# Patient Record
Sex: Female | Born: 1945
Health system: Southern US, Community
[De-identification: ages and names within clinical notes are randomized; demographics above are authoritative.]

## PROBLEM LIST (undated history)

## (undated) DIAGNOSIS — E039 Hypothyroidism, unspecified: Secondary | ICD-10-CM

## (undated) DIAGNOSIS — Z8489 Family history of other specified conditions: Secondary | ICD-10-CM

## (undated) DIAGNOSIS — J45909 Unspecified asthma, uncomplicated: Secondary | ICD-10-CM

## (undated) DIAGNOSIS — I10 Essential (primary) hypertension: Secondary | ICD-10-CM

## (undated) DIAGNOSIS — K219 Gastro-esophageal reflux disease without esophagitis: Secondary | ICD-10-CM

## (undated) HISTORY — PX: EYE SURGERY: SHX253

## (undated) HISTORY — PX: BREAST SURGERY: SHX581

---

## 1968-10-02 HISTORY — PX: THYROID SURGERY: SHX805

## 2000-03-11 ENCOUNTER — Other Ambulatory Visit: Admission: RE | Admit: 2000-03-11 | Discharge: 2000-03-11 | Payer: Self-pay | Admitting: Obstetrics and Gynecology

## 2000-04-05 ENCOUNTER — Ambulatory Visit (HOSPITAL_COMMUNITY): Admission: RE | Admit: 2000-04-05 | Discharge: 2000-04-05 | Payer: Self-pay | Admitting: Obstetrics and Gynecology

## 2000-04-05 ENCOUNTER — Encounter: Payer: Self-pay | Admitting: Obstetrics and Gynecology

## 2000-04-13 ENCOUNTER — Encounter (INDEPENDENT_AMBULATORY_CARE_PROVIDER_SITE_OTHER): Payer: Self-pay

## 2000-04-13 ENCOUNTER — Other Ambulatory Visit: Admission: RE | Admit: 2000-04-13 | Discharge: 2000-04-13 | Payer: Self-pay | Admitting: Obstetrics and Gynecology

## 2000-07-13 ENCOUNTER — Other Ambulatory Visit: Admission: RE | Admit: 2000-07-13 | Discharge: 2000-07-13 | Payer: Self-pay | Admitting: Obstetrics and Gynecology

## 2000-07-13 ENCOUNTER — Encounter (INDEPENDENT_AMBULATORY_CARE_PROVIDER_SITE_OTHER): Payer: Self-pay | Admitting: Specialist

## 2002-04-24 ENCOUNTER — Other Ambulatory Visit: Admission: RE | Admit: 2002-04-24 | Discharge: 2002-04-24 | Payer: Self-pay | Admitting: Obstetrics and Gynecology

## 2002-06-18 ENCOUNTER — Encounter (INDEPENDENT_AMBULATORY_CARE_PROVIDER_SITE_OTHER): Payer: Self-pay

## 2002-06-18 ENCOUNTER — Ambulatory Visit (HOSPITAL_COMMUNITY): Admission: RE | Admit: 2002-06-18 | Discharge: 2002-06-18 | Payer: Self-pay | Admitting: Gastroenterology

## 2003-04-30 ENCOUNTER — Other Ambulatory Visit: Admission: RE | Admit: 2003-04-30 | Discharge: 2003-04-30 | Payer: Self-pay | Admitting: Obstetrics and Gynecology

## 2004-05-11 ENCOUNTER — Other Ambulatory Visit: Admission: RE | Admit: 2004-05-11 | Discharge: 2004-05-11 | Payer: Self-pay | Admitting: Obstetrics and Gynecology

## 2005-09-07 ENCOUNTER — Other Ambulatory Visit: Admission: RE | Admit: 2005-09-07 | Discharge: 2005-09-07 | Payer: Self-pay | Admitting: Obstetrics and Gynecology

## 2005-12-30 ENCOUNTER — Ambulatory Visit (HOSPITAL_COMMUNITY): Admission: RE | Admit: 2005-12-30 | Discharge: 2005-12-30 | Payer: Self-pay | Admitting: Family Medicine

## 2005-12-30 ENCOUNTER — Encounter (INDEPENDENT_AMBULATORY_CARE_PROVIDER_SITE_OTHER): Payer: Self-pay | Admitting: *Deleted

## 2006-09-30 ENCOUNTER — Other Ambulatory Visit: Admission: RE | Admit: 2006-09-30 | Discharge: 2006-09-30 | Payer: Self-pay | Admitting: Obstetrics and Gynecology

## 2007-10-03 ENCOUNTER — Other Ambulatory Visit: Admission: RE | Admit: 2007-10-03 | Discharge: 2007-10-03 | Payer: Self-pay | Admitting: Obstetrics and Gynecology

## 2008-02-02 HISTORY — PX: NASAL POLYP SURGERY: SHX186

## 2008-10-24 ENCOUNTER — Other Ambulatory Visit: Admission: RE | Admit: 2008-10-24 | Discharge: 2008-10-24 | Payer: Self-pay | Admitting: Obstetrics and Gynecology

## 2009-10-30 ENCOUNTER — Other Ambulatory Visit: Admission: RE | Admit: 2009-10-30 | Discharge: 2009-10-30 | Payer: Self-pay | Admitting: Obstetrics and Gynecology

## 2010-06-19 NOTE — Op Note (Signed)
   NAME:  Krystal Ward, Krystal Ward                      ACCOUNT NO.:  [id number]   MEDICAL RECORD NO.:  [id number]                   PATIENT TYPE:  AMB   LOCATION:  ENDO                                 FACILITY:  [facility]   PHYSICIAN:  Danise Edge, M.D.                DATE OF BIRTH:  [birth year]   DATE OF PROCEDURE:  06/18/2002  DATE OF DISCHARGE:                                 OPERATIVE REPORT   PROCEDURE:  Screening colonoscopy.   REFERRING PHYSICIAN:  Artist Pais, M.D.   INDICATIONS:  The patient is a 65 year old female born 11/17/1945.  Her father  was diagnosed with colon cancer at age 30.  She is scheduled to undergo her  second screening colonoscopy with polypectomy to prevent colon cancer.   ENDOSCOPIST:  Danise Edge, M.D.   PREMEDICATION:  Demerol 50 mg, Versed 6 mg.   DESCRIPTION OF PROCEDURE:  After obtaining informed consent, the patient was  placed in the left lateral decubitus position.  I administered intravenous  Demerol and intravenous Versed to achieve conscious sedation for the  procedure.  The patient's blood pressure, oxygen saturation, and cardiac  rhythm were monitored throughout the procedure and documented in the medical  record.   Anal inspection was normal.  Digital rectal exam was normal.  The Olympus  pediatric colonoscope was introduced into the rectum and advanced to the  cecum.  Colonic preparation for the exam today was excellent.   Rectum normal.   Sigmoid colon and descending colon:  Shallow left colonic diverticulosis  without diverticulitis or diverticular stricture formation.   Splenic flexure:  At approximately 80 cm from the anal verge, around the  splenic flexure, a 2 mm sessile polyp was removed with the electrocautery  snare and submitted for pathologic interpretation.   Transverse colon normal.   Hepatic flexure normal.   Ascending colon normal.   Cecum and ileocecal valve normal.   ASSESSMENT:  1. Shallow left colonic  diverticulosis.  2.     At 80 cm from the anal verge, I think in the splenic flexure, a 2 mm sessile     polyp was removed with electrocautery snare.   RECOMMENDATIONS:  Repeat colonoscopy in approximately five years.                                               Danise Edge, M.D.    MJ/MEDQ  D:  06/18/2002  T:  06/18/2002  Job:  [id number]   cc:   Artist Pais, M.D.  [address], [address]  [place]  [place] [address]  Fax: [phone]

## 2011-02-23 DIAGNOSIS — Z1231 Encounter for screening mammogram for malignant neoplasm of breast: Secondary | ICD-10-CM | POA: Diagnosis not present

## 2011-03-01 DIAGNOSIS — J310 Chronic rhinitis: Secondary | ICD-10-CM | POA: Diagnosis not present

## 2011-03-01 DIAGNOSIS — J331 Polypoid sinus degeneration: Secondary | ICD-10-CM | POA: Diagnosis not present

## 2011-03-25 DIAGNOSIS — R609 Edema, unspecified: Secondary | ICD-10-CM | POA: Diagnosis not present

## 2011-03-25 DIAGNOSIS — E7800 Pure hypercholesterolemia, unspecified: Secondary | ICD-10-CM | POA: Diagnosis not present

## 2011-03-25 DIAGNOSIS — Z1331 Encounter for screening for depression: Secondary | ICD-10-CM | POA: Diagnosis not present

## 2011-03-25 DIAGNOSIS — I10 Essential (primary) hypertension: Secondary | ICD-10-CM | POA: Diagnosis not present

## 2011-03-25 DIAGNOSIS — E039 Hypothyroidism, unspecified: Secondary | ICD-10-CM | POA: Diagnosis not present

## 2011-04-09 DIAGNOSIS — J3089 Other allergic rhinitis: Secondary | ICD-10-CM | POA: Diagnosis not present

## 2011-04-09 DIAGNOSIS — J45909 Unspecified asthma, uncomplicated: Secondary | ICD-10-CM | POA: Diagnosis not present

## 2011-06-09 DIAGNOSIS — E039 Hypothyroidism, unspecified: Secondary | ICD-10-CM | POA: Diagnosis not present

## 2011-06-09 DIAGNOSIS — I10 Essential (primary) hypertension: Secondary | ICD-10-CM | POA: Diagnosis not present

## 2011-06-14 DIAGNOSIS — E039 Hypothyroidism, unspecified: Secondary | ICD-10-CM | POA: Diagnosis not present

## 2011-06-14 DIAGNOSIS — I10 Essential (primary) hypertension: Secondary | ICD-10-CM | POA: Diagnosis not present

## 2011-06-14 DIAGNOSIS — E7800 Pure hypercholesterolemia, unspecified: Secondary | ICD-10-CM | POA: Diagnosis not present

## 2011-06-14 DIAGNOSIS — R05 Cough: Secondary | ICD-10-CM | POA: Diagnosis not present

## 2011-06-14 DIAGNOSIS — Z23 Encounter for immunization: Secondary | ICD-10-CM | POA: Diagnosis not present

## 2011-06-14 DIAGNOSIS — R059 Cough, unspecified: Secondary | ICD-10-CM | POA: Diagnosis not present

## 2011-06-26 DIAGNOSIS — J069 Acute upper respiratory infection, unspecified: Secondary | ICD-10-CM | POA: Diagnosis not present

## 2011-06-26 DIAGNOSIS — R05 Cough: Secondary | ICD-10-CM | POA: Diagnosis not present

## 2011-08-03 DIAGNOSIS — R062 Wheezing: Secondary | ICD-10-CM | POA: Diagnosis not present

## 2011-08-03 DIAGNOSIS — R05 Cough: Secondary | ICD-10-CM | POA: Diagnosis not present

## 2011-08-03 DIAGNOSIS — R059 Cough, unspecified: Secondary | ICD-10-CM | POA: Diagnosis not present

## 2011-09-16 DIAGNOSIS — E039 Hypothyroidism, unspecified: Secondary | ICD-10-CM | POA: Diagnosis not present

## 2011-09-16 DIAGNOSIS — I10 Essential (primary) hypertension: Secondary | ICD-10-CM | POA: Diagnosis not present

## 2011-09-16 DIAGNOSIS — R05 Cough: Secondary | ICD-10-CM | POA: Diagnosis not present

## 2011-09-16 DIAGNOSIS — J45909 Unspecified asthma, uncomplicated: Secondary | ICD-10-CM | POA: Diagnosis not present

## 2011-09-16 DIAGNOSIS — E7800 Pure hypercholesterolemia, unspecified: Secondary | ICD-10-CM | POA: Diagnosis not present

## 2011-09-16 DIAGNOSIS — Z23 Encounter for immunization: Secondary | ICD-10-CM | POA: Diagnosis not present

## 2011-09-16 DIAGNOSIS — H26499 Other secondary cataract, unspecified eye: Secondary | ICD-10-CM | POA: Diagnosis not present

## 2011-09-16 DIAGNOSIS — R059 Cough, unspecified: Secondary | ICD-10-CM | POA: Diagnosis not present

## 2011-10-20 ENCOUNTER — Other Ambulatory Visit: Payer: Self-pay | Admitting: Gastroenterology

## 2011-10-20 DIAGNOSIS — Z1211 Encounter for screening for malignant neoplasm of colon: Secondary | ICD-10-CM | POA: Diagnosis not present

## 2011-10-20 DIAGNOSIS — K5730 Diverticulosis of large intestine without perforation or abscess without bleeding: Secondary | ICD-10-CM | POA: Diagnosis not present

## 2011-10-20 DIAGNOSIS — Z800 Family history of malignant neoplasm of digestive organs: Secondary | ICD-10-CM | POA: Diagnosis not present

## 2011-10-20 DIAGNOSIS — D126 Benign neoplasm of colon, unspecified: Secondary | ICD-10-CM | POA: Diagnosis not present

## 2011-10-28 DIAGNOSIS — Z23 Encounter for immunization: Secondary | ICD-10-CM | POA: Diagnosis not present

## 2011-11-24 ENCOUNTER — Other Ambulatory Visit (HOSPITAL_COMMUNITY)
Admission: RE | Admit: 2011-11-24 | Discharge: 2011-11-24 | Disposition: A | Discharge status: Home / Self Care | Payer: Medicare Other | Source: Ambulatory Visit | Attending: Obstetrics and Gynecology | Admitting: Obstetrics and Gynecology

## 2011-11-24 ENCOUNTER — Other Ambulatory Visit: Payer: Self-pay | Admitting: Obstetrics and Gynecology

## 2011-11-24 DIAGNOSIS — Z124 Encounter for screening for malignant neoplasm of cervix: Secondary | ICD-10-CM | POA: Diagnosis not present

## 2011-11-24 DIAGNOSIS — Z01419 Encounter for gynecological examination (general) (routine) without abnormal findings: Secondary | ICD-10-CM | POA: Diagnosis not present

## 2011-11-24 DIAGNOSIS — K649 Unspecified hemorrhoids: Secondary | ICD-10-CM | POA: Diagnosis not present

## 2012-02-14 DIAGNOSIS — J331 Polypoid sinus degeneration: Secondary | ICD-10-CM | POA: Diagnosis not present

## 2012-02-14 DIAGNOSIS — J310 Chronic rhinitis: Secondary | ICD-10-CM | POA: Diagnosis not present

## 2012-03-22 DIAGNOSIS — Z1231 Encounter for screening mammogram for malignant neoplasm of breast: Secondary | ICD-10-CM | POA: Diagnosis not present

## 2012-03-24 DIAGNOSIS — R7301 Impaired fasting glucose: Secondary | ICD-10-CM | POA: Diagnosis not present

## 2012-03-24 DIAGNOSIS — J45909 Unspecified asthma, uncomplicated: Secondary | ICD-10-CM | POA: Diagnosis not present

## 2012-03-24 DIAGNOSIS — E7800 Pure hypercholesterolemia, unspecified: Secondary | ICD-10-CM | POA: Diagnosis not present

## 2012-03-24 DIAGNOSIS — E039 Hypothyroidism, unspecified: Secondary | ICD-10-CM | POA: Diagnosis not present

## 2012-03-24 DIAGNOSIS — I10 Essential (primary) hypertension: Secondary | ICD-10-CM | POA: Diagnosis not present

## 2012-07-20 DIAGNOSIS — D485 Neoplasm of uncertain behavior of skin: Secondary | ICD-10-CM | POA: Diagnosis not present

## 2012-07-20 DIAGNOSIS — L723 Sebaceous cyst: Secondary | ICD-10-CM | POA: Diagnosis not present

## 2012-07-20 DIAGNOSIS — E039 Hypothyroidism, unspecified: Secondary | ICD-10-CM | POA: Diagnosis not present

## 2012-08-22 DIAGNOSIS — J328 Other chronic sinusitis: Secondary | ICD-10-CM | POA: Diagnosis not present

## 2012-08-22 DIAGNOSIS — J310 Chronic rhinitis: Secondary | ICD-10-CM | POA: Diagnosis not present

## 2012-08-22 DIAGNOSIS — J331 Polypoid sinus degeneration: Secondary | ICD-10-CM | POA: Diagnosis not present

## 2012-08-24 ENCOUNTER — Other Ambulatory Visit: Payer: Self-pay | Admitting: Family Medicine

## 2012-08-24 DIAGNOSIS — D045 Carcinoma in situ of skin of trunk: Secondary | ICD-10-CM | POA: Diagnosis not present

## 2012-08-24 DIAGNOSIS — L723 Sebaceous cyst: Secondary | ICD-10-CM | POA: Diagnosis not present

## 2012-08-24 DIAGNOSIS — C44519 Basal cell carcinoma of skin of other part of trunk: Secondary | ICD-10-CM | POA: Diagnosis not present

## 2012-09-27 DIAGNOSIS — N183 Chronic kidney disease, stage 3 unspecified: Secondary | ICD-10-CM | POA: Diagnosis not present

## 2012-09-27 DIAGNOSIS — J45909 Unspecified asthma, uncomplicated: Secondary | ICD-10-CM | POA: Diagnosis not present

## 2012-09-27 DIAGNOSIS — I10 Essential (primary) hypertension: Secondary | ICD-10-CM | POA: Diagnosis not present

## 2012-09-27 DIAGNOSIS — D649 Anemia, unspecified: Secondary | ICD-10-CM | POA: Diagnosis not present

## 2012-09-27 DIAGNOSIS — E7800 Pure hypercholesterolemia, unspecified: Secondary | ICD-10-CM | POA: Diagnosis not present

## 2012-09-27 DIAGNOSIS — E039 Hypothyroidism, unspecified: Secondary | ICD-10-CM | POA: Diagnosis not present

## 2012-09-28 DIAGNOSIS — H26499 Other secondary cataract, unspecified eye: Secondary | ICD-10-CM | POA: Diagnosis not present

## 2012-11-28 DIAGNOSIS — Z23 Encounter for immunization: Secondary | ICD-10-CM | POA: Diagnosis not present

## 2012-12-12 DIAGNOSIS — C44519 Basal cell carcinoma of skin of other part of trunk: Secondary | ICD-10-CM | POA: Diagnosis not present

## 2012-12-12 DIAGNOSIS — L819 Disorder of pigmentation, unspecified: Secondary | ICD-10-CM | POA: Diagnosis not present

## 2012-12-27 DIAGNOSIS — Z01419 Encounter for gynecological examination (general) (routine) without abnormal findings: Secondary | ICD-10-CM | POA: Diagnosis not present

## 2012-12-27 DIAGNOSIS — K649 Unspecified hemorrhoids: Secondary | ICD-10-CM | POA: Diagnosis not present

## 2013-01-16 DIAGNOSIS — E039 Hypothyroidism, unspecified: Secondary | ICD-10-CM | POA: Diagnosis not present

## 2013-01-16 DIAGNOSIS — D485 Neoplasm of uncertain behavior of skin: Secondary | ICD-10-CM | POA: Diagnosis not present

## 2013-02-06 ENCOUNTER — Other Ambulatory Visit: Payer: Self-pay | Admitting: Family Medicine

## 2013-02-06 DIAGNOSIS — D239 Other benign neoplasm of skin, unspecified: Secondary | ICD-10-CM | POA: Diagnosis not present

## 2013-02-06 DIAGNOSIS — D485 Neoplasm of uncertain behavior of skin: Secondary | ICD-10-CM | POA: Diagnosis not present

## 2013-02-21 DIAGNOSIS — J331 Polypoid sinus degeneration: Secondary | ICD-10-CM | POA: Diagnosis not present

## 2013-02-21 DIAGNOSIS — J310 Chronic rhinitis: Secondary | ICD-10-CM | POA: Diagnosis not present

## 2013-03-23 DIAGNOSIS — R922 Inconclusive mammogram: Secondary | ICD-10-CM | POA: Diagnosis not present

## 2013-03-23 DIAGNOSIS — Z1231 Encounter for screening mammogram for malignant neoplasm of breast: Secondary | ICD-10-CM | POA: Diagnosis not present

## 2013-04-04 DIAGNOSIS — I10 Essential (primary) hypertension: Secondary | ICD-10-CM | POA: Diagnosis not present

## 2013-04-04 DIAGNOSIS — J45909 Unspecified asthma, uncomplicated: Secondary | ICD-10-CM | POA: Diagnosis not present

## 2013-04-04 DIAGNOSIS — E7800 Pure hypercholesterolemia, unspecified: Secondary | ICD-10-CM | POA: Diagnosis not present

## 2013-04-04 DIAGNOSIS — E039 Hypothyroidism, unspecified: Secondary | ICD-10-CM | POA: Diagnosis not present

## 2013-10-04 DIAGNOSIS — E7800 Pure hypercholesterolemia, unspecified: Secondary | ICD-10-CM | POA: Diagnosis not present

## 2013-10-04 DIAGNOSIS — M6740 Ganglion, unspecified site: Secondary | ICD-10-CM | POA: Diagnosis not present

## 2013-10-04 DIAGNOSIS — D235 Other benign neoplasm of skin of trunk: Secondary | ICD-10-CM | POA: Diagnosis not present

## 2013-10-04 DIAGNOSIS — E039 Hypothyroidism, unspecified: Secondary | ICD-10-CM | POA: Diagnosis not present

## 2013-10-04 DIAGNOSIS — Z23 Encounter for immunization: Secondary | ICD-10-CM | POA: Diagnosis not present

## 2013-10-04 DIAGNOSIS — I10 Essential (primary) hypertension: Secondary | ICD-10-CM | POA: Diagnosis not present

## 2013-10-04 DIAGNOSIS — H26499 Other secondary cataract, unspecified eye: Secondary | ICD-10-CM | POA: Diagnosis not present

## 2013-10-04 DIAGNOSIS — J45909 Unspecified asthma, uncomplicated: Secondary | ICD-10-CM | POA: Diagnosis not present

## 2013-10-23 DIAGNOSIS — M6740 Ganglion, unspecified site: Secondary | ICD-10-CM | POA: Diagnosis not present

## 2013-10-25 DIAGNOSIS — M722 Plantar fascial fibromatosis: Secondary | ICD-10-CM

## 2013-11-02 DIAGNOSIS — J324 Chronic pansinusitis: Secondary | ICD-10-CM | POA: Diagnosis not present

## 2013-11-02 DIAGNOSIS — J331 Polypoid sinus degeneration: Secondary | ICD-10-CM | POA: Diagnosis not present

## 2013-11-02 DIAGNOSIS — J310 Chronic rhinitis: Secondary | ICD-10-CM | POA: Diagnosis not present

## 2013-11-05 DIAGNOSIS — H26492 Other secondary cataract, left eye: Secondary | ICD-10-CM | POA: Diagnosis not present

## 2013-11-07 DIAGNOSIS — J331 Polypoid sinus degeneration: Secondary | ICD-10-CM | POA: Diagnosis not present

## 2013-11-07 DIAGNOSIS — Z1389 Encounter for screening for other disorder: Secondary | ICD-10-CM | POA: Diagnosis not present

## 2013-11-07 DIAGNOSIS — M25569 Pain in unspecified knee: Secondary | ICD-10-CM | POA: Diagnosis not present

## 2013-11-07 DIAGNOSIS — J324 Chronic pansinusitis: Secondary | ICD-10-CM | POA: Diagnosis not present

## 2013-11-15 ENCOUNTER — Other Ambulatory Visit: Payer: Self-pay | Admitting: Otolaryngology

## 2013-11-15 ENCOUNTER — Encounter (HOSPITAL_COMMUNITY)
Admission: RE | Admit: 2013-11-15 | Discharge: 2013-11-15 | Disposition: A | Discharge status: Home / Self Care | Payer: Medicare Other | Source: Ambulatory Visit | Attending: Otolaryngology | Admitting: Otolaryngology

## 2013-11-15 ENCOUNTER — Encounter (HOSPITAL_COMMUNITY): Payer: Self-pay

## 2013-11-15 ENCOUNTER — Encounter (HOSPITAL_COMMUNITY)
Admission: RE | Admit: 2013-11-15 | Discharge: 2013-11-15 | Disposition: A | Discharge status: Home / Self Care | Payer: Medicare Other | Source: Ambulatory Visit | Attending: Anesthesiology | Admitting: Anesthesiology

## 2013-11-15 ENCOUNTER — Encounter (HOSPITAL_COMMUNITY): Payer: Self-pay | Admitting: Pharmacy Technician

## 2013-11-15 DIAGNOSIS — Z01818 Encounter for other preprocedural examination: Secondary | ICD-10-CM | POA: Insufficient documentation

## 2013-11-15 DIAGNOSIS — I10 Essential (primary) hypertension: Secondary | ICD-10-CM | POA: Insufficient documentation

## 2013-11-15 DIAGNOSIS — J339 Nasal polyp, unspecified: Secondary | ICD-10-CM | POA: Insufficient documentation

## 2013-11-15 DIAGNOSIS — J45909 Unspecified asthma, uncomplicated: Secondary | ICD-10-CM | POA: Diagnosis not present

## 2013-11-15 DIAGNOSIS — K219 Gastro-esophageal reflux disease without esophagitis: Secondary | ICD-10-CM | POA: Diagnosis not present

## 2013-11-15 DIAGNOSIS — Z01811 Encounter for preprocedural respiratory examination: Secondary | ICD-10-CM

## 2013-11-15 HISTORY — DX: Gastro-esophageal reflux disease without esophagitis: K21.9

## 2013-11-15 HISTORY — DX: Hypothyroidism, unspecified: E03.9

## 2013-11-15 HISTORY — DX: Unspecified asthma, uncomplicated: J45.909

## 2013-11-15 HISTORY — DX: Family history of other specified conditions: Z84.89

## 2013-11-15 HISTORY — DX: Essential (primary) hypertension: I10

## 2013-11-15 LAB — BASIC METABOLIC PANEL
ANION GAP: 12 (ref 5–15)
BUN: 27 mg/dL — ABNORMAL HIGH (ref 6–23)
CO2: 27 meq/L (ref 19–32)
CREATININE: 1.68 mg/dL — AB (ref 0.50–1.10)
Calcium: 9.8 mg/dL (ref 8.4–10.5)
Chloride: 104 mEq/L (ref 96–112)
GFR calc Af Amer: 35 mL/min — ABNORMAL LOW (ref >90)
GFR, EST NON AFRICAN AMERICAN: 30 mL/min — AB (ref >90)
Glucose, Bld: 116 mg/dL — ABNORMAL HIGH (ref 70–99)
Potassium: 3.8 mEq/L (ref 3.7–5.3)
Sodium: 143 mEq/L (ref 137–147)

## 2013-11-15 LAB — CBC
HEMATOCRIT: 35.5 % — AB (ref 36.0–46.0)
Hemoglobin: 12 g/dL (ref 12.0–15.0)
MCH: 28.2 pg (ref 26.0–34.0)
MCHC: 33.8 g/dL (ref 30.0–36.0)
MCV: 83.3 fL (ref 78.0–100.0)
PLATELETS: 182 10*3/uL (ref 150–400)
RBC: 4.26 MIL/uL (ref 3.87–5.11)
RDW: 14.5 % (ref 11.5–15.5)
WBC: 8.4 10*3/uL (ref 4.0–10.5)

## 2013-11-15 IMAGING — CR DG CHEST 2V
2 series · 2 of 2 positions shown · non-contrast
Comparison: [DATE]

CLINICAL DATA: Preoperative a endoscopic sinus surgery and nasal
polypectomy; hypertension; asthma

EXAM:
CHEST  2 VIEW

[w chest pa]
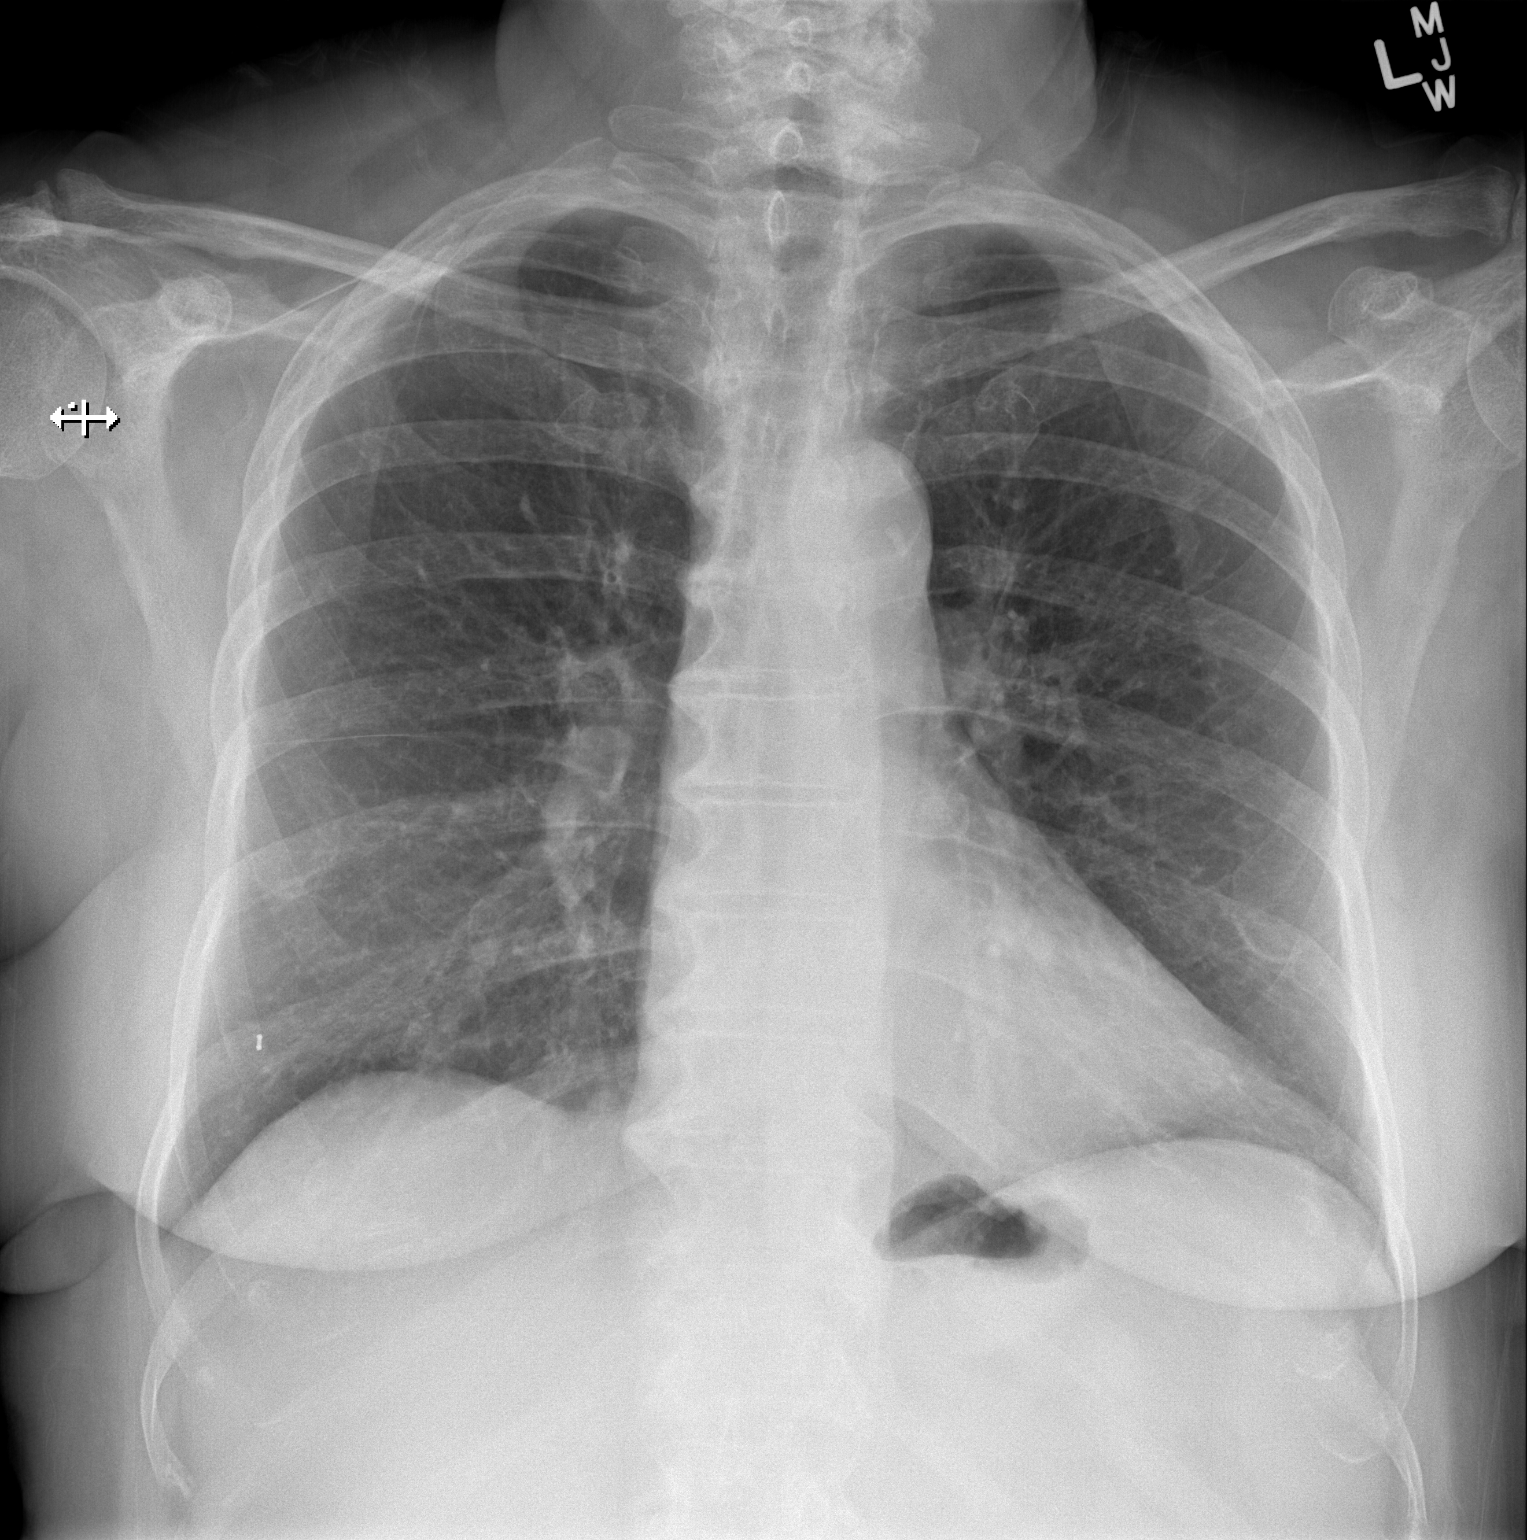

[w chest lat]
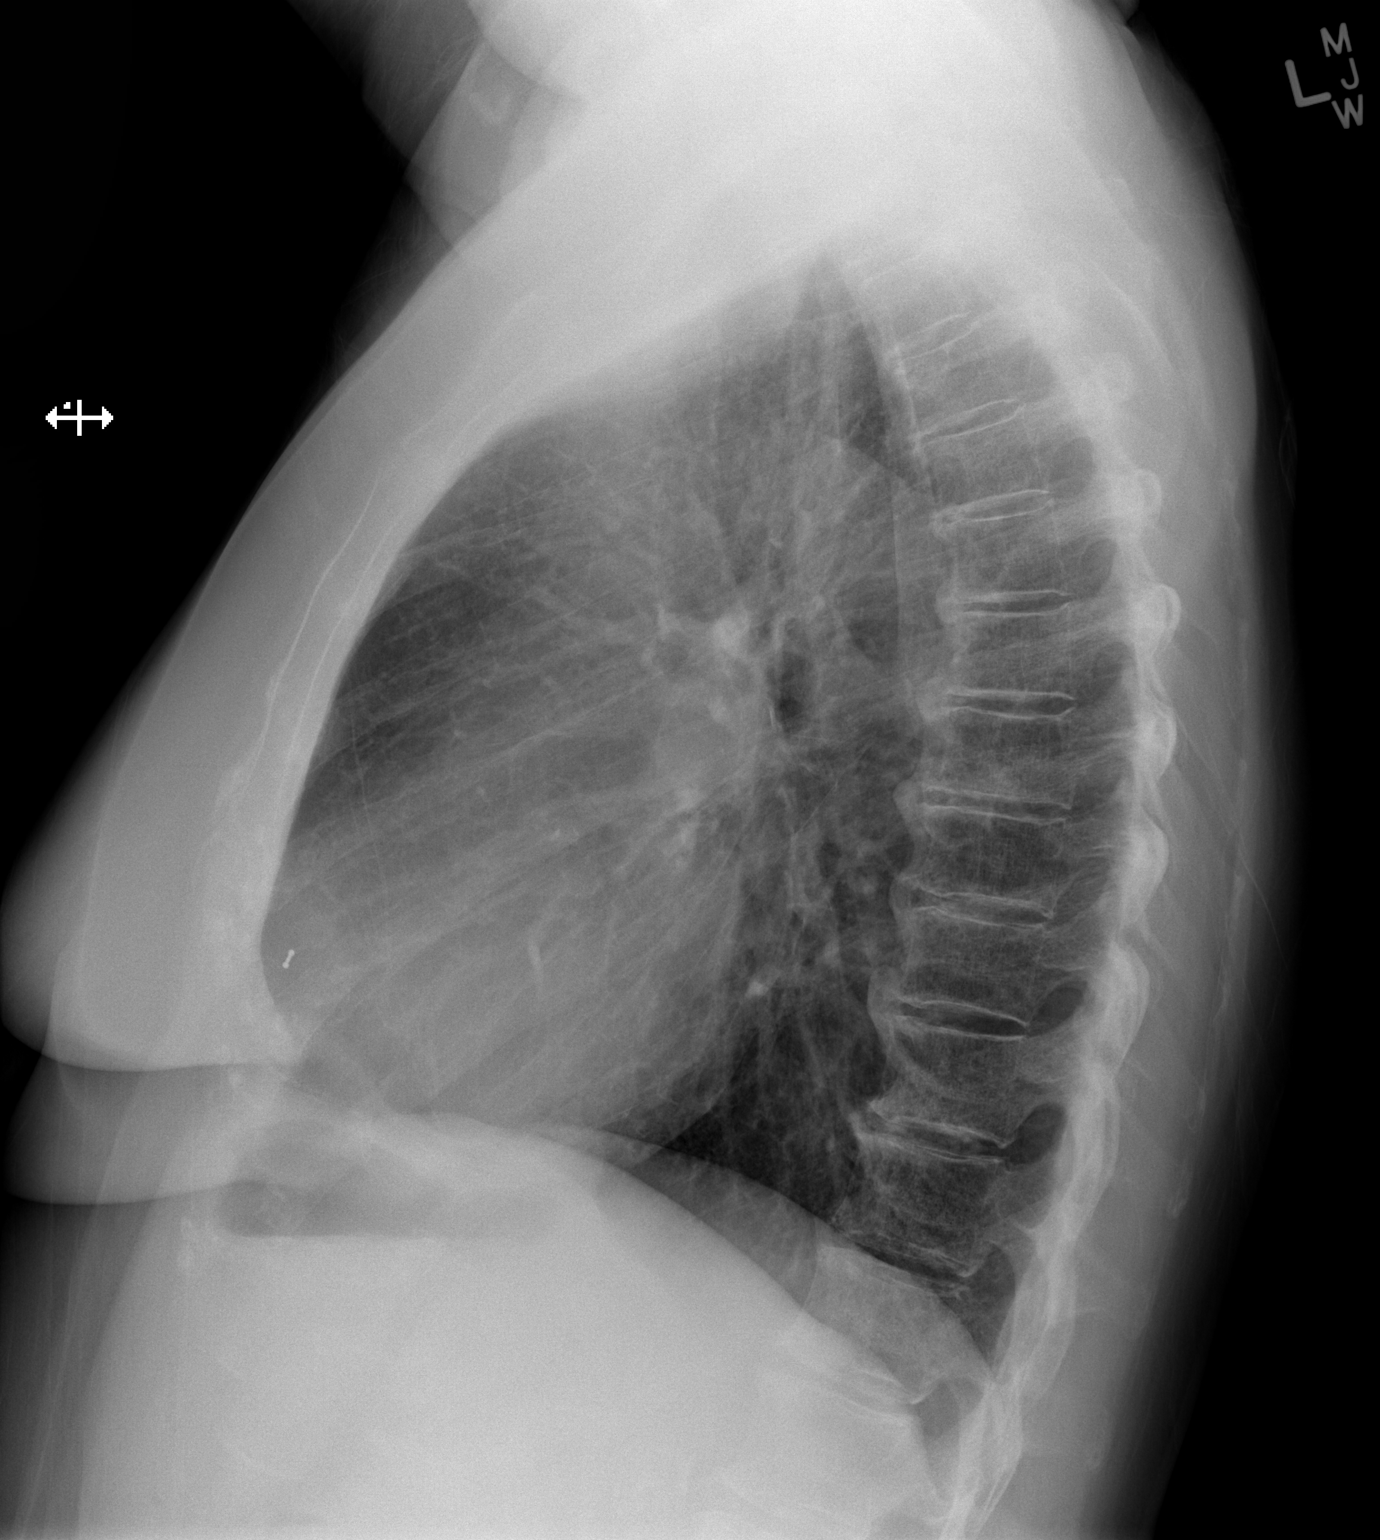

[2 of 2 positions shown; findings below may reference images not displayed]

FINDINGS: There is no edema or consolidation. There is a small radiopaque
foreign body either in or overlying the periphery of the right
middle lobe, stable. Heart size and pulmonary vascularity are
normal. No adenopathy. There is degenerative change in the thoracic
spine.
IMPRESSION: There is a small radiopaque foreign body which is either in or
overlying the periphery of the right middle lobe anteriorly. It is
possible that this opacity represents a clip in the right breast. If
the patient has not had breast surgery, this small radiopaque
foreign body is felt to reside within the lung and may represent a
previously aspirated small metallic foreign body. Lungs elsewhere
are clear. No adenopathy.

## 2013-11-15 NOTE — Progress Notes (Signed)
BUN 27, Creatine 1.68 - no history of kidney disease per patient.  I requested labs and last office note from Dr Jerrol Banana, [place], [place].

## 2013-11-15 NOTE — Pre-Procedure Instructions (Addendum)
Krystal Ward  11/15/2013  Your procedure is scheduled on:  Thursday, October 22.  Report to [facility] Admitting at 7:00 AM.  Call this number if you have problems the morning of surgery: [phone]   Remember:   Do not eat food or drink liquids after midnight Wedneday, October 21.   Take these medicines the morning of surgery with A SIP OF WATER: Levothyroxine (Synthyroid), Metoprolol.  May use Nasal Spray and inhaler.     Do not wear jewelry, make-up or nail polish.  Do not wear lotions, powders, or perfumes.   Do not shave 48 hours prior to surgery.   Do not bring valuables to the hospital.               [facility] is not responsible for any belongings or valuables.               Contacts, dentures or bridgework may not be worn into surgery.  Leave suitcase in the car. After surgery it may be brought to your room.  For patients admitted to the hospital, discharge time is determined by your treatment team.               Patients discharged the day of surgery will not be allowed to drive home.  Name and phone number of your driver:    Special Instructions: Review  [place] - Preparing For Surgery.   Please read over the following fact sheets that you were given: Pain Booklet, Coughing and Deep Breathing and Surgical Site Infection Prevention

## 2013-11-16 ENCOUNTER — Encounter (HOSPITAL_COMMUNITY): Payer: Self-pay

## 2013-11-16 NOTE — Progress Notes (Signed)
Anesthesia Chart Review:  Patient is a 68 year old female posted for revision bilateral endoscopic sinus surgery, nasal polypectomy on 11/22/13 by Dr. Wilburn Cornelia.   History includes non-smoker, HTN, GERD, partial thyroidectomy '70's (for benign nodules), hypothyroidism, asthma. BMI is consistent with mild obesity.  PCP is Dr. Briscoe Deutscher.  Labs received from Dr. Delman Kitten office on 04/04/13 were consistent with renal insufficiency (consider CKD stage III), although patient did not report any history of CKD.  EKG on 11/15/13 showed NSR.  CXR on 11/15/13 showed: There is a small radiopaque foreign body which is either in or overlying the periphery of the right middle lobe anteriorly (also seen on 08/03/2011 CXR). It is possible that this opacity represents a clip in the right breast. If the patient has not had breast surgery, this small radiopaque foreign body is felt to reside within the lung and may represent a previously aspirated small metallic foreign body. Lungs elsewhere are clear. No adenopathy. (I called patient, and she did confirm a previous history of right breast biopsy.)  Preoperative labs noted. BUN 27/Cr 1.68, GFR 30.  (04/04/13 labs showed BUN 18/Cr 1.41, GFR 37.)  I spoke with patient regarding labs results and encouraged continued follow-up with Dr. Maceo Pro. She gave verbal permission to forward results to Dr. Maceo Pro for review which I did.  Since she has had an elevated BUN/Cr in the past, I will not plan to repeat any labs preoperatively.  George Hugh [facility] Short Stay Center/Anesthesiology Phone [phone] 11/16/2013 10:36 AM

## 2013-11-21 MED ORDER — CEFAZOLIN SODIUM-DEXTROSE 2-3 GM-% IV SOLR
2.0000 g | INTRAVENOUS | Status: AC
  Administered 2013-11-22: 2 g via INTRAVENOUS
  Filled 2013-11-21: qty 50

## 2013-11-22 ENCOUNTER — Encounter (HOSPITAL_COMMUNITY): Payer: Medicare Other | Admitting: Vascular Surgery

## 2013-11-22 ENCOUNTER — Ambulatory Visit (HOSPITAL_BASED_OUTPATIENT_CLINIC_OR_DEPARTMENT_OTHER)
Admission: RE | Admit: 2013-11-22 | Discharge: 2013-11-22 | Disposition: A | Discharge status: Home / Self Care | Payer: Medicare Other | Source: Ambulatory Visit | Attending: Otolaryngology | Admitting: Otolaryngology

## 2013-11-22 ENCOUNTER — Encounter (HOSPITAL_COMMUNITY): Payer: Self-pay | Admitting: *Deleted

## 2013-11-22 ENCOUNTER — Ambulatory Visit (HOSPITAL_COMMUNITY): Payer: Medicare Other | Admitting: Certified Registered Nurse Anesthetist

## 2013-11-22 ENCOUNTER — Encounter (HOSPITAL_COMMUNITY)
Admission: RE | Disposition: A | Discharge status: Home / Self Care | Payer: Self-pay | Source: Ambulatory Visit | Attending: Otolaryngology

## 2013-11-22 DIAGNOSIS — K219 Gastro-esophageal reflux disease without esophagitis: Secondary | ICD-10-CM | POA: Insufficient documentation

## 2013-11-22 DIAGNOSIS — J338 Other polyp of sinus: Secondary | ICD-10-CM | POA: Insufficient documentation

## 2013-11-22 DIAGNOSIS — J329 Chronic sinusitis, unspecified: Secondary | ICD-10-CM | POA: Diagnosis not present

## 2013-11-22 DIAGNOSIS — Z7951 Long term (current) use of inhaled steroids: Secondary | ICD-10-CM | POA: Insufficient documentation

## 2013-11-22 DIAGNOSIS — J322 Chronic ethmoidal sinusitis: Secondary | ICD-10-CM | POA: Diagnosis not present

## 2013-11-22 DIAGNOSIS — J45909 Unspecified asthma, uncomplicated: Secondary | ICD-10-CM | POA: Insufficient documentation

## 2013-11-22 DIAGNOSIS — I10 Essential (primary) hypertension: Secondary | ICD-10-CM | POA: Insufficient documentation

## 2013-11-22 DIAGNOSIS — J339 Nasal polyp, unspecified: Secondary | ICD-10-CM | POA: Diagnosis not present

## 2013-11-22 DIAGNOSIS — J323 Chronic sphenoidal sinusitis: Secondary | ICD-10-CM | POA: Diagnosis not present

## 2013-11-22 DIAGNOSIS — J321 Chronic frontal sinusitis: Secondary | ICD-10-CM | POA: Diagnosis not present

## 2013-11-22 DIAGNOSIS — Z79899 Other long term (current) drug therapy: Secondary | ICD-10-CM | POA: Diagnosis not present

## 2013-11-22 DIAGNOSIS — E039 Hypothyroidism, unspecified: Secondary | ICD-10-CM | POA: Insufficient documentation

## 2013-11-22 DIAGNOSIS — J320 Chronic maxillary sinusitis: Secondary | ICD-10-CM | POA: Diagnosis not present

## 2013-11-22 DIAGNOSIS — J331 Polypoid sinus degeneration: Secondary | ICD-10-CM | POA: Diagnosis not present

## 2013-11-22 HISTORY — PX: SINUS ENDO W/FUSION: SHX777

## 2013-11-22 HISTORY — PX: POLYPECTOMY: SHX149

## 2013-11-22 SURGERY — SINUS SURGERY, ENDOSCOPIC, USING COMPUTER-ASSISTED NAVIGATION
Anesthesia: General | Site: Nose | Laterality: Bilateral

## 2013-11-22 MED ORDER — DEXAMETHASONE SODIUM PHOSPHATE 10 MG/ML IJ SOLN
INTRAMUSCULAR | Status: AC
  Filled 2013-11-22: qty 1

## 2013-11-22 MED ORDER — PROPOFOL 10 MG/ML IV BOLUS
INTRAVENOUS | Status: DC | PRN
  Administered 2013-11-22: 200 mg via INTRAVENOUS

## 2013-11-22 MED ORDER — LACTATED RINGERS IV SOLN
INTRAVENOUS | Status: DC
  Administered 2013-11-22: 07:00:00 via INTRAVENOUS

## 2013-11-22 MED ORDER — AMOXICILLIN-POT CLAVULANATE 500-125 MG PO TABS: 1.0000 | ORAL_TABLET | Freq: Two times a day (BID) | ORAL | Status: DC

## 2013-11-22 MED ORDER — MIDAZOLAM HCL 5 MG/5ML IJ SOLN
INTRAMUSCULAR | Status: DC | PRN
  Administered 2013-11-22: 2 mg via INTRAVENOUS

## 2013-11-22 MED ORDER — TRIAMCINOLONE ACETONIDE 40 MG/ML IJ SUSP
INTRAMUSCULAR | Status: AC
  Filled 2013-11-22: qty 5

## 2013-11-22 MED ORDER — GLYCOPYRROLATE 0.2 MG/ML IJ SOLN
INTRAMUSCULAR | Status: AC
  Filled 2013-11-22: qty 2

## 2013-11-22 MED ORDER — LIDOCAINE HCL (CARDIAC) 20 MG/ML IV SOLN
INTRAVENOUS | Status: AC
  Filled 2013-11-22: qty 5

## 2013-11-22 MED ORDER — LACTATED RINGERS IV SOLN
INTRAVENOUS | Status: DC | PRN
  Administered 2013-11-22 (×2): via INTRAVENOUS

## 2013-11-22 MED ORDER — TRIAMCINOLONE ACETONIDE 40 MG/ML IJ SUSP
INTRAMUSCULAR | Status: DC | PRN
Start: 2013-11-22 — End: 2013-11-22
  Administered 2013-11-22: 5 mL

## 2013-11-22 MED ORDER — ROCURONIUM BROMIDE 100 MG/10ML IV SOLN
INTRAVENOUS | Status: DC | PRN
  Administered 2013-11-22: 30 mg via INTRAVENOUS

## 2013-11-22 MED ORDER — OXYMETAZOLINE HCL 0.05 % NA SOLN
NASAL | Status: DC | PRN
  Administered 2013-11-22: 1 via NASAL

## 2013-11-22 MED ORDER — MUPIROCIN CALCIUM 2 % EX CREA
TOPICAL_CREAM | CUTANEOUS | Status: AC
  Filled 2013-11-22: qty 15

## 2013-11-22 MED ORDER — PHENYLEPHRINE 40 MCG/ML (10ML) SYRINGE FOR IV PUSH (FOR BLOOD PRESSURE SUPPORT)
PREFILLED_SYRINGE | INTRAVENOUS | Status: AC
  Filled 2013-11-22: qty 10

## 2013-11-22 MED ORDER — ARTIFICIAL TEARS OP OINT
TOPICAL_OINTMENT | OPHTHALMIC | Status: DC | PRN
  Administered 2013-11-22: 1 via OPHTHALMIC

## 2013-11-22 MED ORDER — FENTANYL CITRATE 0.05 MG/ML IJ SOLN
INTRAMUSCULAR | Status: AC
  Filled 2013-11-22: qty 5

## 2013-11-22 MED ORDER — EPHEDRINE SULFATE 50 MG/ML IJ SOLN
INTRAMUSCULAR | Status: AC
  Filled 2013-11-22: qty 1

## 2013-11-22 MED ORDER — NEOSTIGMINE METHYLSULFATE 10 MG/10ML IV SOLN
INTRAVENOUS | Status: AC
  Filled 2013-11-22: qty 1

## 2013-11-22 MED ORDER — PHENYLEPHRINE HCL 10 MG/ML IJ SOLN
INTRAMUSCULAR | Status: DC | PRN
  Administered 2013-11-22 (×3): 80 ug via INTRAVENOUS

## 2013-11-22 MED ORDER — HYDROCODONE-ACETAMINOPHEN 5-325 MG PO TABS: 1.0000 | ORAL_TABLET | Freq: Four times a day (QID) | ORAL | Status: DC | PRN

## 2013-11-22 MED ORDER — SODIUM CHLORIDE 0.9 % IR SOLN
Status: DC | PRN
  Administered 2013-11-22: 1000 mL

## 2013-11-22 MED ORDER — ROCURONIUM BROMIDE 50 MG/5ML IV SOLN
INTRAVENOUS | Status: AC
  Filled 2013-11-22: qty 2

## 2013-11-22 MED ORDER — OXYMETAZOLINE HCL 0.05 % NA SOLN
NASAL | Status: AC
  Filled 2013-11-22: qty 15

## 2013-11-22 MED ORDER — MUPIROCIN CALCIUM 2 % EX CREA
TOPICAL_CREAM | CUTANEOUS | Status: DC | PRN
  Administered 2013-11-22: 1 via TOPICAL

## 2013-11-22 MED ORDER — LIDOCAINE-EPINEPHRINE 1 %-1:100000 IJ SOLN
INTRAMUSCULAR | Status: AC
  Filled 2013-11-22: qty 1

## 2013-11-22 MED ORDER — FENTANYL CITRATE 0.05 MG/ML IJ SOLN
INTRAMUSCULAR | Status: DC | PRN
  Administered 2013-11-22: 50 ug via INTRAVENOUS
  Administered 2013-11-22: 25 ug via INTRAVENOUS
  Administered 2013-11-22: 50 ug via INTRAVENOUS

## 2013-11-22 MED ORDER — OXYMETAZOLINE HCL 0.05 % NA SOLN
NASAL | Status: DC | PRN
  Administered 2013-11-22: 2 via NASAL

## 2013-11-22 MED ORDER — NEOSTIGMINE METHYLSULFATE 10 MG/10ML IV SOLN
INTRAVENOUS | Status: DC | PRN
  Administered 2013-11-22: 3 mg via INTRAVENOUS

## 2013-11-22 MED ORDER — DEXAMETHASONE SODIUM PHOSPHATE 10 MG/ML IJ SOLN
10.0000 mg | Freq: Once | INTRAMUSCULAR | Status: AC
  Administered 2013-11-22: 10 mg via INTRAVENOUS

## 2013-11-22 MED ORDER — LIDOCAINE-EPINEPHRINE 1 %-1:100000 IJ SOLN
INTRAMUSCULAR | Status: DC | PRN
  Administered 2013-11-22: 8 mL

## 2013-11-22 MED ORDER — PHENYLEPHRINE HCL 10 MG/ML IJ SOLN
10.0000 mg | INTRAVENOUS | Status: DC | PRN
  Administered 2013-11-22: 10 ug/min via INTRAVENOUS

## 2013-11-22 MED ORDER — SODIUM CHLORIDE 0.9 % IJ SOLN
INTRAMUSCULAR | Status: AC
  Filled 2013-11-22: qty 10

## 2013-11-22 MED ORDER — GLYCOPYRROLATE 0.2 MG/ML IJ SOLN
INTRAMUSCULAR | Status: DC | PRN
  Administered 2013-11-22: 0.4 mg via INTRAVENOUS

## 2013-11-22 MED ORDER — ONDANSETRON HCL 4 MG/2ML IJ SOLN
INTRAMUSCULAR | Status: DC | PRN
Start: 2013-11-22 — End: 2013-11-22
  Administered 2013-11-22 (×2): 4 mg via INTRAVENOUS

## 2013-11-22 MED ORDER — MIDAZOLAM HCL 2 MG/2ML IJ SOLN
INTRAMUSCULAR | Status: AC
  Filled 2013-11-22: qty 2

## 2013-11-22 MED ORDER — LIDOCAINE HCL (CARDIAC) 20 MG/ML IV SOLN
INTRAVENOUS | Status: DC | PRN
  Administered 2013-11-22: 60 mg via INTRAVENOUS

## 2013-11-22 SURGICAL SUPPLY — 49 items
BLADE ROTATE RAD 40 4 M4 (BLADE) ×1 IMPLANT
BLADE ROTATE RAD 40 4MM M4 (BLADE) ×1
BLADE ROTATE TRICUT 4MX13CM M4 (BLADE) ×1
BLADE ROTATE TRICUT 4X13 M4 (BLADE) ×2 IMPLANT
BLADE SURG 15 STRL LF DISP TIS (BLADE) IMPLANT
BLADE SURG 15 STRL SS (BLADE) ×3
BLADE TRICUT ROTATE M4 4 5PK (BLADE) ×2 IMPLANT
BLADE TRICUT ROTATE M4 4MM 5PK (BLADE) ×1
CANISTER SUCTION 2500CC (MISCELLANEOUS) ×6 IMPLANT
CONT SPEC 4OZ CLIKSEAL STRL BL (MISCELLANEOUS) ×2 IMPLANT
DRAPE PROXIMA HALF (DRAPES) ×2 IMPLANT
DRESSING NASAL KENNEDY 3.5X.9 (MISCELLANEOUS) IMPLANT
DRSG NASAL KENNEDY 3.5X.9 (MISCELLANEOUS) ×3
ELECT COATED BLADE 2.86 ST (ELECTRODE) ×2 IMPLANT
ELECT REM PT RETURN 9FT ADLT (ELECTROSURGICAL) ×3
ELECTRODE REM PT RTRN 9FT ADLT (ELECTROSURGICAL) ×1 IMPLANT
FILTER ARTHROSCOPY CONVERTOR (FILTER) ×4 IMPLANT
GLOVE BIO SURGEON STRL SZ8 (GLOVE) ×2 IMPLANT
GLOVE BIOGEL M 7.0 STRL (GLOVE) ×6 IMPLANT
GLOVE BIOGEL PI IND STRL 6.5 (GLOVE) IMPLANT
GLOVE BIOGEL PI IND STRL 8 (GLOVE) IMPLANT
GLOVE BIOGEL PI INDICATOR 6.5 (GLOVE) ×2
GLOVE BIOGEL PI INDICATOR 8 (GLOVE) ×2
GLOVE SURG SS PI 6.5 STRL IVOR (GLOVE) ×2 IMPLANT
GOWN STRL REUS W/ TWL LRG LVL3 (GOWN DISPOSABLE) ×2 IMPLANT
GOWN STRL REUS W/TWL 2XL LVL3 (GOWN DISPOSABLE) ×2 IMPLANT
GOWN STRL REUS W/TWL LRG LVL3 (GOWN DISPOSABLE) ×6
KIT BASIN OR (CUSTOM PROCEDURE TRAY) ×3 IMPLANT
KIT ROOM TURNOVER OR (KITS) ×3 IMPLANT
NDL 18GX1X1/2 (RX/OR ONLY) (NEEDLE) IMPLANT
NDL HYPO 25GX1X1/2 BEV (NEEDLE) IMPLANT
NEEDLE 18GX1X1/2 (RX/OR ONLY) (NEEDLE) ×3 IMPLANT
NEEDLE HYPO 25GX1X1/2 BEV (NEEDLE) ×3 IMPLANT
NS IRRIG 1000ML POUR BTL (IV SOLUTION) ×3 IMPLANT
PAD ARMBOARD 7.5X6 YLW CONV (MISCELLANEOUS) ×4 IMPLANT
PAD ENT ADHESIVE 25PK (MISCELLANEOUS) ×3 IMPLANT
PENCIL BUTTON HOLSTER BLD 10FT (ELECTRODE) ×2 IMPLANT
SPECIMEN JAR SMALL (MISCELLANEOUS) ×4 IMPLANT
SPONGE NEURO XRAY DETECT 1X3 (DISPOSABLE) ×3 IMPLANT
SYR CONTROL 10ML LL (SYRINGE) ×2 IMPLANT
TRACKER ENT INSTRUMENT (MISCELLANEOUS) ×3 IMPLANT
TRACKER ENT PATIENT (MISCELLANEOUS) ×3 IMPLANT
TRAY ENT MC OR (CUSTOM PROCEDURE TRAY) ×3 IMPLANT
TUBE CONNECTING 12'X1/4 (SUCTIONS) ×1
TUBE CONNECTING 12X1/4 (SUCTIONS) ×2 IMPLANT
TUBING EXTENTION W/L.L. (IV SETS) ×3 IMPLANT
TUBING STRAIGHTSHOT EPS 5PK (TUBING) ×3 IMPLANT
WATER STERILE IRR 1000ML POUR (IV SOLUTION) ×3 IMPLANT
WIPE INSTRUMENT VISIWIPE 73X73 (MISCELLANEOUS) ×3 IMPLANT

## 2013-11-22 NOTE — Anesthesia Preprocedure Evaluation (Signed)
Anesthesia Evaluation  Patient identified by MRN, date of birth, ID band Patient awake    Reviewed: Allergy & Precautions, H&P , NPO status , Patient's Chart, lab work & pertinent test results  Airway       Dental   Pulmonary asthma ,          Cardiovascular hypertension,     Neuro/Psych    GI/Hepatic GERD-  ,  Endo/Other  Hypothyroidism   Renal/GU      Musculoskeletal   Abdominal   Peds  Hematology   Anesthesia Other Findings   Reproductive/Obstetrics                           Anesthesia Physical Anesthesia Plan  ASA: II  Anesthesia Plan: General   Post-op Pain Management:    Induction:   Airway Management Planned: Oral ETT  Additional Equipment:   Intra-op Plan:   Post-operative Plan: Extubation in OR  Informed Consent: I have reviewed the patients History and Physical, chart, labs and discussed the procedure including the risks, benefits and alternatives for the proposed anesthesia with the patient or authorized representative who has indicated his/her understanding and acceptance.     Plan Discussed with: CRNA, Anesthesiologist and Surgeon  Anesthesia Plan Comments:         Anesthesia Quick Evaluation

## 2013-11-22 NOTE — Discharge Instructions (Signed)

## 2013-11-22 NOTE — Anesthesia Postprocedure Evaluation (Signed)
  Anesthesia Post-op Note  Patient: Krystal Ward  Procedure(s) Performed: Procedure(s): REVISION BILATERAL ENDOSCOPIC SINUS SURGERY (Bilateral) POLYPECTOMY NASAL (Bilateral)  Patient Location: PACU  Anesthesia Type:General  Level of Consciousness: awake, alert , oriented and patient cooperative  Airway and Oxygen Therapy: Patient Spontanous Breathing  Post-op Pain: mild  Post-op Assessment: Post-op Vital signs reviewed, Patient's Cardiovascular Status Stable, Respiratory Function Stable, Patent Airway, No signs of Nausea or vomiting and Pain level controlled  Post-op Vital Signs: stable  Last Vitals:  Filed Vitals:   11/22/13 1300  BP: 113/59  Pulse: 64  Temp: 36.4 C  Resp: 16    Complications: No apparent anesthesia complications

## 2013-11-22 NOTE — H&P (Signed)
Krystal Ward is an 68 y.o. female.   Chief Complaint: Nasal polyps  HPI: chronic polyps and nasal obstruction, prev ESS  Past Medical History  Diagnosis Date  . Family history of anesthesia complication     Mother has N/V  . Hypertension   . Asthma   . Hypothyroidism   . GERD (gastroesophageal reflux disease)     tums prn    Past Surgical History  Procedure Laterality Date  . Thyroid surgery  1970's     Removed 3/4  . Nasal polyp surgery  2010  . Eye surgery Bilateral     bil Restore Lens  . Breast surgery      right breast biopsy    History reviewed. No pertinent family history. Social History:  reports that she has never smoked. She does not have any smokeless tobacco history on file. She reports that she drinks about one ounce of alcohol per week. She reports that she does not use illicit drugs.  Allergies: Not on File  Medications Prior to Admission  Medication Sig Dispense Refill  . Ascorbic Acid (VITAMIN C) 1000 MG tablet Take 1,000 mg by mouth daily.      . budesonide-formoterol (SYMBICORT) 160-4.5 MCG/ACT inhaler Inhale 2 puffs into the lungs 2 (two) times daily.      . Calcium Carb-Cholecalciferol (CALCIUM 600 + D PO) Take 1 tablet by mouth 2 (two) times daily.      . Cholecalciferol (VITAMIN D3) 1000 UNITS CAPS Take 1 capsule by mouth daily.      . Chromium Picolinate 1000 MCG TABS Take 1,000 mcg by mouth daily.      . fluticasone (FLONASE) 50 MCG/ACT nasal spray Place 2 sprays into both nostrils daily.      Marland Kitchen levothyroxine (SYNTHROID, LEVOTHROID) 137 MCG tablet Take 137 mcg by mouth daily before breakfast.      . losartan (COZAAR) 100 MG tablet Take 100 mg by mouth daily.      Marland Kitchen lovastatin (MEVACOR) 40 MG tablet Take 40 mg by mouth at bedtime.      . metoprolol succinate (TOPROL-XL) 50 MG 24 hr tablet Take 50 mg by mouth daily. Take with or immediately following a meal.      . triamterene-hydrochlorothiazide (DYAZIDE) 37.5-25 MG per capsule Take 1 capsule  by mouth daily.        No results found for this or any previous visit (from the past 48 hour(s)). No results found.  Review of Systems  Constitutional: Negative.   Respiratory: Negative.   Cardiovascular: Negative.   Gastrointestinal: Negative.   Genitourinary: Negative.     Blood pressure 148/58, pulse 69, temperature 98.6 F (37 C), temperature source Oral, resp. rate 20, weight 92.08 kg (203 lb), SpO2 98.00%. Physical Exam  Constitutional: She is oriented to person, place, and time. She appears well-developed and well-nourished.  HENT:  Nasal polyps with obstruction  Neck: Normal range of motion. Neck supple.  Cardiovascular: Normal rate.   Respiratory: Effort normal.  GI: Soft.  Musculoskeletal: Normal range of motion.  Neurological: She is alert and oriented to person, place, and time.     Assessment/Plan Adm for OP nasal polypectomy and rev ESS.  Krystal Ward, Krystal Ward 11/22/2013, 9:37 AM

## 2013-11-22 NOTE — Op Note (Signed)
NAMEZELTA, ENFIELD NO.:  [id number]  MEDICAL RECORD NO.:  [id number]  LOCATION:                                 FACILITY:  PHYSICIAN:  Early Chars. Wilburn Cornelia, M.D.DATE OF BIRTH:  03/17/1945  DATE OF PROCEDURE:  11/19/2013 DATE OF DISCHARGE:  11/22/2013                              OPERATIVE REPORT   PREOPERATIVE DIAGNOSIS:  Chronic sinonasal polyposis.  POSTOPERATIVE DIAGNOSIS:  Chronic sinonasal polyposis.  INDICATION FOR SURGERY:  Chronic sinonasal polyposis.  SURGICAL PROCEDURES: 1. Bilateral revision endoscopic sinus surgery with intraoperative     computer-assisted navigation (fusion) consisting of bilateral total     ethmoidectomy, bilateral maxillary antrostomy with removal of the     diseased tissue, bilateral nasal frontal recess exploration, and     right sphenoidotomy with removal of tissue. 2. Endoscopic nasal debridement/polypectomy.  ANESTHESIA:  General endotracheal.  SURGEON:  Early Chars. Wilburn Cornelia, M.D.  COMPLICATIONS:  None.  ESTIMATED BLOOD LOSS:  300 mL.  FINDINGS:  Extensive sinonasal polyps involving the nasal cavity with near complete obstruction and all major sinuses.  DISPOSITION:  The patient was transferred from the operating room to the recovery room in stable condition.  BRIEF HISTORY:  The patient is a 68 year old female who has a history of chronic nasal polyposis and sinusitis.  She has been followed in our office who has undergone previous sinus surgery for nasal polyposis, last performed in 2010.  Over the last 6 months, the patient has noted increasing symptoms of nasal congestion, nasal airway obstruction and anosmia.  Examination in the office showed significant polypoid disease with obstruction of the nasal passage way.  She was treated with steroids and antibiotics, and a CT scan was obtained, which showed extensive soft tissue disease involving the entire nasal passageway and opacification of all sinuses.   Findings were consistent with chronic recurrent polypoid disease.  Given her history and findings, I recommended a revision endoscopic sinus surgery.  The risks and benefits were discussed, and the patient and her husband understood and concurred with our plan for surgery which is scheduled on elective basis at [facility] on November 19, 2013.  DESCRIPTION OF PROCEDURE:  The patient was brought to the operating room and placed in a supine position on the operating table.  General endotracheal anesthesia was established without difficulty.  When the patient was adequately anesthetized, she was positioned on the operating table and prepped and draped in a sterile fashion.  Her nose was injected with a total of 8 mL of 1% lidocaine 1:100,000 solution of epinephrine which was injected within the intranasal polyps, lateral nasal wall, and via transoral sphenopalatine block.  Her nose was then packed with Afrin-soaked cottonoid pledgets and were left in place for approximately 10 minutes to allow for vasoconstriction hemostasis.  The __________ fusion headgear was applied and anatomic and surgical landmarks were identified and confirmed.  The fusion device was used throughout the sinus surgery.  With the patient prepared for surgery, a zero-degree endoscope was used to examine her bilaterally.  Packing was removed from the nasal passageway.  She had heavy nasal polyposis and nasal polypectomy and debridement was then performed with a 0-degree telescope and a  straight microdebrider.  Polyps within the nasal cavity were resected from inferior to superior clearing the middle meatus and superior nasal passageway of polypoid disease.  This procedure was carried out bilaterally to clear the nasal cavity of polyps and bilateral Afrin- soaked cottonoid pledgets were replaced within the nasal cavity for hemostasis.  With the patient's nasal passageway cleared, endoscopic sinus surgery was  then undertaken using a 0-degree telescope.  A total ethmoidectomy was performed.  Polypoid disease was resected along the floor of the ethmoid sinus from anterior to posterior.  The posterior superior ethmoid region was identified and dissection was then carried out from posterior to anterior using a 0-degree telescope and a curved microdebrider with navigation.  The nasal frontal recess was identified and polypoid material was resected from the anterior ethmoid nasal frontal and frontal sinuses.  Attention was then turned to lateral nasal wall where the ostium of the sinus was occluded with polyps, again using a 45 degree telescope and a curved microdebrider.  The right maxillary sinus was cleared of polyps and thick mucinous debris.  The sphenoid ostium was identified posteriorly and again occluded with polyps which were resected using a straight microdebrider and a 0-degree endoscope. From within the right sphenoid sinus, thick amount of mucopurulent material was cleared creating a widely patent ostium and clear sinus.  Attention was then turned to the patient's left-hand side.  Again using a 0-degree telescope, the middle meatus was explored and a total ethmoidectomy was performed dissecting from anterior to posterior along the floor of the ethmoid sinuses and resecting polypoid disease.  Bony septations and heavy polyps were then resected from the superior and anterior aspect of the ethmoid sinus using a 45 degree telescope and a curved microdebrider, clearing polyp disease and extending into the nasal frontal recess with polyps resected from within the frontal sinus. The nasofrontal recess was cleared of additional disease creating a widely patent frontal sinus.  Attention was then turned to the lateral nasal wall with a 45 degree telescope and a curved microdebrider and polyps were resected from the natural ostium of the sinus and into the entire left maxillary sinus which was  filled with polyps and mucinous debris.  The patient's nasal cavity was then irrigated and suctioned.  Surgical debris was cleared.  A 50-50 mixture of Kenalog 40 and Bactroban cream were then instilled in the frontal ethmoid and maxillary sinuses bilaterally, and bilateral Kennedy sinus packs were placed and hydrated with sterile saline.  The nasopharynx was suctioned and irrigated.  An orogastric tube was passed.  The stomach contents were aspirated.  The patient was then awakened from anesthetic.  She was extubated and was transferred from operating room to recovery room in stable condition. There were no complications.  Estimated blood loss was approximately 300 mL.  Polypoid disease was cleared, and at the conclusion of the procedure, Kenalog and Bactroban slurry was instilled in the sinuses.  Bilateral Kennedy sinus packs were placed in the common ethmoid cavities bilaterally.          ______________________________ Early Chars Wilburn Cornelia, M.D.     DLS/MEDQ  D:  42/59/5638  T:  11/22/2013  Job:  [id number]

## 2013-11-22 NOTE — Anesthesia Procedure Notes (Signed)
Procedure Name: Intubation Date/Time: 11/22/2013 10:34 AM Performed by: Garner Nash Pre-anesthesia Checklist: Patient identified, Timeout performed, Emergency Drugs available, Suction available and Patient being monitored Patient Re-evaluated:Patient Re-evaluated prior to inductionOxygen Delivery Method: Circle system utilized Preoxygenation: Pre-oxygenation with 100% oxygen Intubation Type: IV induction Ventilation: Mask ventilation without difficulty Laryngoscope Size: Mac and 3 Grade View: Grade II Tube type: Oral Number of attempts: 1 Airway Equipment and Method: Stylet Placement Confirmation: ETT inserted through vocal cords under direct vision,  positive ETCO2,  CO2 detector and breath sounds checked- equal and bilateral Secured at: 21 cm Tube secured with: Tape Dental Injury: Teeth and Oropharynx as per pre-operative assessment  Comments: Small OA, prominent teeth, recessed chin

## 2013-11-22 NOTE — Transfer of Care (Signed)
Immediate Anesthesia Transfer of Care Note  Patient: Krystal Ward  Procedure(s) Performed: Procedure(s): REVISION BILATERAL ENDOSCOPIC SINUS SURGERY (Bilateral) POLYPECTOMY NASAL (Bilateral)  Patient Location: PACU  Anesthesia Type:General  Level of Consciousness: awake, alert  and oriented  Airway & Oxygen Therapy: Patient Spontanous Breathing and Patient connected to face mask oxygen  Post-op Assessment: Report given to PACU RN and Post -op Vital signs reviewed and stable  Post vital signs: Reviewed and stable  Complications: No apparent anesthesia complications

## 2013-11-22 NOTE — Brief Op Note (Signed)
11/22/2013  12:06 PM  PATIENT:  Krystal Ward  68 y.o. female  PRE-OPERATIVE DIAGNOSIS:  CHRONIC SINUSITIS  POST-OPERATIVE DIAGNOSIS:  CHRONIC SINUSITIS  PROCEDURE:  Procedure(s): REVISION BILATERAL ENDOSCOPIC SINUS SURGERY (Bilateral) POLYPECTOMY NASAL (Bilateral)  SURGEON:  Surgeon(s) and Role:    * Jerrell Belfast, MD - Primary  PHYSICIAN ASSISTANT:   ASSISTANTS: none   ANESTHESIA:   general  EBL:  Total I/O In: 1200 [I.V.:1200] Out: 310 [Blood:310]  BLOOD ADMINISTERED:none  DRAINS: none   LOCAL MEDICATIONS USED:  LIDOCAINE  and Amount: 8 ml  SPECIMEN:  Source of Specimen:  Bil. sinus contents  DISPOSITION OF SPECIMEN:  PATHOLOGY  COUNTS:  YES  TOURNIQUET:  * No tourniquets in log *  DICTATION: .Other Dictation: Dictation Number [id number]  PLAN OF CARE: Discharge to home after PACU  PATIENT DISPOSITION:  PACU - hemodynamically stable.   Delay start of Pharmacological VTE agent (>24hrs) due to surgical blood loss or risk of bleeding: not applicable

## 2013-11-23 ENCOUNTER — Encounter (HOSPITAL_COMMUNITY): Payer: Self-pay | Admitting: Otolaryngology

## 2014-01-10 ENCOUNTER — Other Ambulatory Visit (HOSPITAL_COMMUNITY)
Admission: RE | Admit: 2014-01-10 | Discharge: 2014-01-10 | Disposition: A | Discharge status: Home / Self Care | Payer: Medicare Other | Source: Ambulatory Visit | Attending: Obstetrics and Gynecology | Admitting: Obstetrics and Gynecology

## 2014-01-10 ENCOUNTER — Other Ambulatory Visit: Payer: Self-pay | Admitting: Obstetrics and Gynecology

## 2014-01-10 DIAGNOSIS — Z1151 Encounter for screening for human papillomavirus (HPV): Secondary | ICD-10-CM | POA: Diagnosis not present

## 2014-01-10 DIAGNOSIS — K640 First degree hemorrhoids: Secondary | ICD-10-CM | POA: Diagnosis not present

## 2014-01-10 DIAGNOSIS — N952 Postmenopausal atrophic vaginitis: Secondary | ICD-10-CM | POA: Diagnosis not present

## 2014-01-10 DIAGNOSIS — Z01419 Encounter for gynecological examination (general) (routine) without abnormal findings: Secondary | ICD-10-CM | POA: Insufficient documentation

## 2014-01-10 DIAGNOSIS — Z01411 Encounter for gynecological examination (general) (routine) with abnormal findings: Secondary | ICD-10-CM | POA: Diagnosis not present

## 2014-01-14 LAB — CYTOLOGY - PAP

## 2014-02-28 DIAGNOSIS — J310 Chronic rhinitis: Secondary | ICD-10-CM | POA: Diagnosis not present

## 2014-02-28 DIAGNOSIS — J331 Polypoid sinus degeneration: Secondary | ICD-10-CM | POA: Diagnosis not present

## 2014-03-27 DIAGNOSIS — M79606 Pain in leg, unspecified: Secondary | ICD-10-CM | POA: Diagnosis not present

## 2014-04-02 DIAGNOSIS — J4530 Mild persistent asthma, uncomplicated: Secondary | ICD-10-CM | POA: Diagnosis not present

## 2014-04-02 DIAGNOSIS — E039 Hypothyroidism, unspecified: Secondary | ICD-10-CM | POA: Diagnosis not present

## 2014-04-02 DIAGNOSIS — Z780 Asymptomatic menopausal state: Secondary | ICD-10-CM | POA: Diagnosis not present

## 2014-04-02 DIAGNOSIS — N183 Chronic kidney disease, stage 3 (moderate): Secondary | ICD-10-CM | POA: Diagnosis not present

## 2014-04-02 DIAGNOSIS — I129 Hypertensive chronic kidney disease with stage 1 through stage 4 chronic kidney disease, or unspecified chronic kidney disease: Secondary | ICD-10-CM | POA: Diagnosis not present

## 2014-04-02 DIAGNOSIS — M79606 Pain in leg, unspecified: Secondary | ICD-10-CM | POA: Diagnosis not present

## 2014-04-30 DIAGNOSIS — Z780 Asymptomatic menopausal state: Secondary | ICD-10-CM | POA: Diagnosis not present

## 2014-05-11 DIAGNOSIS — Z1231 Encounter for screening mammogram for malignant neoplasm of breast: Secondary | ICD-10-CM | POA: Diagnosis not present

## 2014-05-14 DIAGNOSIS — Z0000 Encounter for general adult medical examination without abnormal findings: Secondary | ICD-10-CM | POA: Diagnosis not present

## 2014-07-25 DIAGNOSIS — E039 Hypothyroidism, unspecified: Secondary | ICD-10-CM | POA: Diagnosis not present

## 2014-07-25 DIAGNOSIS — M791 Myalgia: Secondary | ICD-10-CM | POA: Diagnosis not present

## 2014-07-25 DIAGNOSIS — I739 Peripheral vascular disease, unspecified: Secondary | ICD-10-CM | POA: Diagnosis not present

## 2014-07-29 ENCOUNTER — Other Ambulatory Visit: Payer: Self-pay | Admitting: Family Medicine

## 2014-07-29 DIAGNOSIS — I739 Peripheral vascular disease, unspecified: Secondary | ICD-10-CM

## 2014-08-01 ENCOUNTER — Ambulatory Visit
Admission: RE | Admit: 2014-08-01 | Discharge: 2014-08-01 | Disposition: A | Discharge status: Home / Self Care | Payer: Medicare Other | Source: Ambulatory Visit | Attending: Family Medicine | Admitting: Family Medicine

## 2014-08-01 DIAGNOSIS — I739 Peripheral vascular disease, unspecified: Secondary | ICD-10-CM

## 2014-09-24 DIAGNOSIS — J310 Chronic rhinitis: Secondary | ICD-10-CM | POA: Diagnosis not present

## 2014-10-10 DIAGNOSIS — E780 Pure hypercholesterolemia: Secondary | ICD-10-CM | POA: Diagnosis not present

## 2014-10-10 DIAGNOSIS — J45909 Unspecified asthma, uncomplicated: Secondary | ICD-10-CM | POA: Diagnosis not present

## 2014-10-10 DIAGNOSIS — N183 Chronic kidney disease, stage 3 (moderate): Secondary | ICD-10-CM | POA: Diagnosis not present

## 2014-10-10 DIAGNOSIS — Z23 Encounter for immunization: Secondary | ICD-10-CM | POA: Diagnosis not present

## 2014-10-10 DIAGNOSIS — I129 Hypertensive chronic kidney disease with stage 1 through stage 4 chronic kidney disease, or unspecified chronic kidney disease: Secondary | ICD-10-CM | POA: Diagnosis not present

## 2014-10-10 DIAGNOSIS — E039 Hypothyroidism, unspecified: Secondary | ICD-10-CM | POA: Diagnosis not present

## 2015-01-16 DIAGNOSIS — N952 Postmenopausal atrophic vaginitis: Secondary | ICD-10-CM | POA: Diagnosis not present

## 2015-01-16 DIAGNOSIS — N9419 Other specified dyspareunia: Secondary | ICD-10-CM | POA: Diagnosis not present

## 2015-02-14 DIAGNOSIS — H04123 Dry eye syndrome of bilateral lacrimal glands: Secondary | ICD-10-CM | POA: Diagnosis not present

## 2015-02-14 DIAGNOSIS — H524 Presbyopia: Secondary | ICD-10-CM | POA: Diagnosis not present

## 2015-02-14 DIAGNOSIS — Z961 Presence of intraocular lens: Secondary | ICD-10-CM | POA: Diagnosis not present

## 2015-04-09 DIAGNOSIS — E7800 Pure hypercholesterolemia, unspecified: Secondary | ICD-10-CM | POA: Diagnosis not present

## 2015-04-09 DIAGNOSIS — N183 Chronic kidney disease, stage 3 (moderate): Secondary | ICD-10-CM | POA: Diagnosis not present

## 2015-04-09 DIAGNOSIS — J309 Allergic rhinitis, unspecified: Secondary | ICD-10-CM | POA: Diagnosis not present

## 2015-04-09 DIAGNOSIS — Z1159 Encounter for screening for other viral diseases: Secondary | ICD-10-CM | POA: Diagnosis not present

## 2015-04-09 DIAGNOSIS — Z23 Encounter for immunization: Secondary | ICD-10-CM | POA: Diagnosis not present

## 2015-04-09 DIAGNOSIS — I10 Essential (primary) hypertension: Secondary | ICD-10-CM | POA: Diagnosis not present

## 2015-04-09 DIAGNOSIS — J45909 Unspecified asthma, uncomplicated: Secondary | ICD-10-CM | POA: Diagnosis not present

## 2015-04-09 DIAGNOSIS — E039 Hypothyroidism, unspecified: Secondary | ICD-10-CM | POA: Diagnosis not present

## 2015-04-15 DIAGNOSIS — Z8709 Personal history of other diseases of the respiratory system: Secondary | ICD-10-CM | POA: Diagnosis not present

## 2015-04-15 DIAGNOSIS — J310 Chronic rhinitis: Secondary | ICD-10-CM | POA: Diagnosis not present

## 2015-04-15 DIAGNOSIS — J302 Other seasonal allergic rhinitis: Secondary | ICD-10-CM | POA: Diagnosis not present

## 2015-05-15 DIAGNOSIS — Z1231 Encounter for screening mammogram for malignant neoplasm of breast: Secondary | ICD-10-CM | POA: Diagnosis not present

## 2015-10-22 DIAGNOSIS — J310 Chronic rhinitis: Secondary | ICD-10-CM | POA: Diagnosis not present

## 2015-10-22 DIAGNOSIS — J338 Other polyp of sinus: Secondary | ICD-10-CM | POA: Diagnosis not present

## 2015-10-24 DIAGNOSIS — J45909 Unspecified asthma, uncomplicated: Secondary | ICD-10-CM | POA: Diagnosis not present

## 2015-10-24 DIAGNOSIS — J309 Allergic rhinitis, unspecified: Secondary | ICD-10-CM | POA: Diagnosis not present

## 2015-10-24 DIAGNOSIS — N183 Chronic kidney disease, stage 3 (moderate): Secondary | ICD-10-CM | POA: Diagnosis not present

## 2015-10-24 DIAGNOSIS — E039 Hypothyroidism, unspecified: Secondary | ICD-10-CM | POA: Diagnosis not present

## 2015-10-24 DIAGNOSIS — I129 Hypertensive chronic kidney disease with stage 1 through stage 4 chronic kidney disease, or unspecified chronic kidney disease: Secondary | ICD-10-CM | POA: Diagnosis not present

## 2015-10-24 DIAGNOSIS — Z23 Encounter for immunization: Secondary | ICD-10-CM | POA: Diagnosis not present

## 2016-01-07 DIAGNOSIS — J069 Acute upper respiratory infection, unspecified: Secondary | ICD-10-CM | POA: Diagnosis not present

## 2016-01-07 DIAGNOSIS — E039 Hypothyroidism, unspecified: Secondary | ICD-10-CM | POA: Diagnosis not present

## 2016-01-07 DIAGNOSIS — J45901 Unspecified asthma with (acute) exacerbation: Secondary | ICD-10-CM | POA: Diagnosis not present

## 2016-02-24 DIAGNOSIS — Z01419 Encounter for gynecological examination (general) (routine) without abnormal findings: Secondary | ICD-10-CM | POA: Diagnosis not present

## 2016-04-15 DIAGNOSIS — Z0000 Encounter for general adult medical examination without abnormal findings: Secondary | ICD-10-CM | POA: Diagnosis not present

## 2016-04-15 DIAGNOSIS — I10 Essential (primary) hypertension: Secondary | ICD-10-CM | POA: Diagnosis not present

## 2016-04-15 DIAGNOSIS — N183 Chronic kidney disease, stage 3 (moderate): Secondary | ICD-10-CM | POA: Diagnosis not present

## 2016-04-15 DIAGNOSIS — E039 Hypothyroidism, unspecified: Secondary | ICD-10-CM | POA: Diagnosis not present

## 2016-04-15 DIAGNOSIS — E669 Obesity, unspecified: Secondary | ICD-10-CM | POA: Diagnosis not present

## 2016-04-15 DIAGNOSIS — J45909 Unspecified asthma, uncomplicated: Secondary | ICD-10-CM | POA: Diagnosis not present

## 2016-04-15 DIAGNOSIS — J309 Allergic rhinitis, unspecified: Secondary | ICD-10-CM | POA: Diagnosis not present

## 2016-04-28 DIAGNOSIS — J310 Chronic rhinitis: Secondary | ICD-10-CM | POA: Diagnosis not present

## 2016-04-28 DIAGNOSIS — J338 Other polyp of sinus: Secondary | ICD-10-CM | POA: Diagnosis not present

## 2016-04-28 DIAGNOSIS — J302 Other seasonal allergic rhinitis: Secondary | ICD-10-CM | POA: Diagnosis not present

## 2016-05-19 DIAGNOSIS — H04123 Dry eye syndrome of bilateral lacrimal glands: Secondary | ICD-10-CM | POA: Diagnosis not present

## 2016-05-19 DIAGNOSIS — H524 Presbyopia: Secondary | ICD-10-CM | POA: Diagnosis not present

## 2016-05-19 DIAGNOSIS — Z961 Presence of intraocular lens: Secondary | ICD-10-CM | POA: Diagnosis not present

## 2016-05-19 DIAGNOSIS — H35371 Puckering of macula, right eye: Secondary | ICD-10-CM | POA: Diagnosis not present

## 2016-05-27 ENCOUNTER — Encounter (INDEPENDENT_AMBULATORY_CARE_PROVIDER_SITE_OTHER): Payer: Medicare Other | Admitting: Ophthalmology

## 2016-05-27 DIAGNOSIS — H35033 Hypertensive retinopathy, bilateral: Secondary | ICD-10-CM

## 2016-05-27 DIAGNOSIS — I10 Essential (primary) hypertension: Secondary | ICD-10-CM | POA: Diagnosis not present

## 2016-05-27 DIAGNOSIS — H35341 Macular cyst, hole, or pseudohole, right eye: Secondary | ICD-10-CM | POA: Diagnosis not present

## 2016-05-27 DIAGNOSIS — H43813 Vitreous degeneration, bilateral: Secondary | ICD-10-CM | POA: Diagnosis not present

## 2016-05-27 DIAGNOSIS — H353121 Nonexudative age-related macular degeneration, left eye, early dry stage: Secondary | ICD-10-CM

## 2016-05-28 DIAGNOSIS — Z1231 Encounter for screening mammogram for malignant neoplasm of breast: Secondary | ICD-10-CM | POA: Diagnosis not present

## 2016-11-03 DIAGNOSIS — Z23 Encounter for immunization: Secondary | ICD-10-CM | POA: Diagnosis not present

## 2016-12-02 ENCOUNTER — Ambulatory Visit (INDEPENDENT_AMBULATORY_CARE_PROVIDER_SITE_OTHER): Payer: Medicare Other | Admitting: Ophthalmology

## 2016-12-02 DIAGNOSIS — I10 Essential (primary) hypertension: Secondary | ICD-10-CM

## 2016-12-02 DIAGNOSIS — H353122 Nonexudative age-related macular degeneration, left eye, intermediate dry stage: Secondary | ICD-10-CM

## 2016-12-02 DIAGNOSIS — H43813 Vitreous degeneration, bilateral: Secondary | ICD-10-CM

## 2016-12-02 DIAGNOSIS — H35033 Hypertensive retinopathy, bilateral: Secondary | ICD-10-CM

## 2016-12-02 DIAGNOSIS — H35371 Puckering of macula, right eye: Secondary | ICD-10-CM

## 2016-12-31 DIAGNOSIS — Z8709 Personal history of other diseases of the respiratory system: Secondary | ICD-10-CM | POA: Diagnosis not present

## 2016-12-31 DIAGNOSIS — J310 Chronic rhinitis: Secondary | ICD-10-CM | POA: Diagnosis not present

## 2017-05-11 DIAGNOSIS — E039 Hypothyroidism, unspecified: Secondary | ICD-10-CM | POA: Diagnosis not present

## 2017-05-11 DIAGNOSIS — J309 Allergic rhinitis, unspecified: Secondary | ICD-10-CM | POA: Diagnosis not present

## 2017-05-11 DIAGNOSIS — I10 Essential (primary) hypertension: Secondary | ICD-10-CM | POA: Diagnosis not present

## 2017-05-11 DIAGNOSIS — Z1211 Encounter for screening for malignant neoplasm of colon: Secondary | ICD-10-CM | POA: Diagnosis not present

## 2017-05-11 DIAGNOSIS — L723 Sebaceous cyst: Secondary | ICD-10-CM | POA: Diagnosis not present

## 2017-05-11 DIAGNOSIS — Z0000 Encounter for general adult medical examination without abnormal findings: Secondary | ICD-10-CM | POA: Diagnosis not present

## 2017-05-11 DIAGNOSIS — J45909 Unspecified asthma, uncomplicated: Secondary | ICD-10-CM | POA: Diagnosis not present

## 2017-05-11 DIAGNOSIS — E669 Obesity, unspecified: Secondary | ICD-10-CM | POA: Diagnosis not present

## 2017-05-18 DIAGNOSIS — L0291 Cutaneous abscess, unspecified: Secondary | ICD-10-CM | POA: Diagnosis not present

## 2017-05-18 DIAGNOSIS — L02213 Cutaneous abscess of chest wall: Secondary | ICD-10-CM | POA: Diagnosis not present

## 2017-05-30 DIAGNOSIS — Z1211 Encounter for screening for malignant neoplasm of colon: Secondary | ICD-10-CM | POA: Diagnosis not present

## 2017-06-01 DIAGNOSIS — Z1231 Encounter for screening mammogram for malignant neoplasm of breast: Secondary | ICD-10-CM | POA: Diagnosis not present

## 2017-06-02 ENCOUNTER — Encounter (INDEPENDENT_AMBULATORY_CARE_PROVIDER_SITE_OTHER): Payer: Medicare Other | Admitting: Ophthalmology

## 2017-06-02 DIAGNOSIS — H353122 Nonexudative age-related macular degeneration, left eye, intermediate dry stage: Secondary | ICD-10-CM | POA: Diagnosis not present

## 2017-06-02 DIAGNOSIS — H35341 Macular cyst, hole, or pseudohole, right eye: Secondary | ICD-10-CM

## 2017-06-02 DIAGNOSIS — H43813 Vitreous degeneration, bilateral: Secondary | ICD-10-CM | POA: Diagnosis not present

## 2017-06-02 DIAGNOSIS — H35033 Hypertensive retinopathy, bilateral: Secondary | ICD-10-CM

## 2017-06-02 DIAGNOSIS — I10 Essential (primary) hypertension: Secondary | ICD-10-CM | POA: Diagnosis not present

## 2017-06-20 DIAGNOSIS — C44519 Basal cell carcinoma of skin of other part of trunk: Secondary | ICD-10-CM | POA: Diagnosis not present

## 2017-06-20 DIAGNOSIS — D485 Neoplasm of uncertain behavior of skin: Secondary | ICD-10-CM | POA: Diagnosis not present

## 2017-06-20 DIAGNOSIS — L720 Epidermal cyst: Secondary | ICD-10-CM | POA: Diagnosis not present

## 2017-06-20 DIAGNOSIS — C4441 Basal cell carcinoma of skin of scalp and neck: Secondary | ICD-10-CM | POA: Diagnosis not present

## 2017-06-29 DIAGNOSIS — L02213 Cutaneous abscess of chest wall: Secondary | ICD-10-CM | POA: Diagnosis not present

## 2017-06-29 DIAGNOSIS — C44519 Basal cell carcinoma of skin of other part of trunk: Secondary | ICD-10-CM | POA: Diagnosis not present

## 2017-06-29 DIAGNOSIS — D485 Neoplasm of uncertain behavior of skin: Secondary | ICD-10-CM | POA: Diagnosis not present

## 2017-06-29 DIAGNOSIS — C4441 Basal cell carcinoma of skin of scalp and neck: Secondary | ICD-10-CM | POA: Diagnosis not present

## 2017-08-08 DIAGNOSIS — L821 Other seborrheic keratosis: Secondary | ICD-10-CM | POA: Diagnosis not present

## 2017-08-08 DIAGNOSIS — C44519 Basal cell carcinoma of skin of other part of trunk: Secondary | ICD-10-CM | POA: Diagnosis not present

## 2017-08-08 DIAGNOSIS — C4441 Basal cell carcinoma of skin of scalp and neck: Secondary | ICD-10-CM | POA: Diagnosis not present

## 2017-08-18 DIAGNOSIS — E039 Hypothyroidism, unspecified: Secondary | ICD-10-CM | POA: Diagnosis not present

## 2017-11-11 DIAGNOSIS — E039 Hypothyroidism, unspecified: Secondary | ICD-10-CM | POA: Diagnosis not present

## 2017-11-11 DIAGNOSIS — J45909 Unspecified asthma, uncomplicated: Secondary | ICD-10-CM | POA: Diagnosis not present

## 2017-11-11 DIAGNOSIS — N183 Chronic kidney disease, stage 3 (moderate): Secondary | ICD-10-CM | POA: Diagnosis not present

## 2017-11-11 DIAGNOSIS — I129 Hypertensive chronic kidney disease with stage 1 through stage 4 chronic kidney disease, or unspecified chronic kidney disease: Secondary | ICD-10-CM | POA: Diagnosis not present

## 2017-11-11 DIAGNOSIS — J309 Allergic rhinitis, unspecified: Secondary | ICD-10-CM | POA: Diagnosis not present

## 2017-11-18 DIAGNOSIS — Z23 Encounter for immunization: Secondary | ICD-10-CM | POA: Diagnosis not present

## 2017-12-14 DIAGNOSIS — D485 Neoplasm of uncertain behavior of skin: Secondary | ICD-10-CM | POA: Diagnosis not present

## 2017-12-14 DIAGNOSIS — L821 Other seborrheic keratosis: Secondary | ICD-10-CM | POA: Diagnosis not present

## 2017-12-14 DIAGNOSIS — C4441 Basal cell carcinoma of skin of scalp and neck: Secondary | ICD-10-CM | POA: Diagnosis not present

## 2017-12-14 DIAGNOSIS — L905 Scar conditions and fibrosis of skin: Secondary | ICD-10-CM | POA: Diagnosis not present

## 2017-12-14 DIAGNOSIS — C44519 Basal cell carcinoma of skin of other part of trunk: Secondary | ICD-10-CM | POA: Diagnosis not present

## 2017-12-20 DIAGNOSIS — C4492 Squamous cell carcinoma of skin, unspecified: Secondary | ICD-10-CM | POA: Diagnosis not present

## 2018-03-08 DIAGNOSIS — N950 Postmenopausal bleeding: Secondary | ICD-10-CM | POA: Diagnosis not present

## 2018-03-08 DIAGNOSIS — Z01419 Encounter for gynecological examination (general) (routine) without abnormal findings: Secondary | ICD-10-CM | POA: Diagnosis not present

## 2018-03-23 DIAGNOSIS — N950 Postmenopausal bleeding: Secondary | ICD-10-CM | POA: Diagnosis not present

## 2018-05-18 DIAGNOSIS — E7800 Pure hypercholesterolemia, unspecified: Secondary | ICD-10-CM | POA: Diagnosis not present

## 2018-05-18 DIAGNOSIS — J45909 Unspecified asthma, uncomplicated: Secondary | ICD-10-CM | POA: Diagnosis not present

## 2018-05-18 DIAGNOSIS — I129 Hypertensive chronic kidney disease with stage 1 through stage 4 chronic kidney disease, or unspecified chronic kidney disease: Secondary | ICD-10-CM | POA: Diagnosis not present

## 2018-05-18 DIAGNOSIS — E669 Obesity, unspecified: Secondary | ICD-10-CM | POA: Diagnosis not present

## 2018-05-18 DIAGNOSIS — N183 Chronic kidney disease, stage 3 (moderate): Secondary | ICD-10-CM | POA: Diagnosis not present

## 2018-05-18 DIAGNOSIS — E039 Hypothyroidism, unspecified: Secondary | ICD-10-CM | POA: Diagnosis not present

## 2018-05-18 DIAGNOSIS — J309 Allergic rhinitis, unspecified: Secondary | ICD-10-CM | POA: Diagnosis not present

## 2018-06-06 DIAGNOSIS — Z1231 Encounter for screening mammogram for malignant neoplasm of breast: Secondary | ICD-10-CM | POA: Diagnosis not present

## 2018-06-08 ENCOUNTER — Encounter (INDEPENDENT_AMBULATORY_CARE_PROVIDER_SITE_OTHER): Payer: Medicare Other | Admitting: Ophthalmology

## 2018-06-08 ENCOUNTER — Other Ambulatory Visit: Payer: Self-pay

## 2018-06-08 DIAGNOSIS — H353122 Nonexudative age-related macular degeneration, left eye, intermediate dry stage: Secondary | ICD-10-CM | POA: Diagnosis not present

## 2018-06-08 DIAGNOSIS — I10 Essential (primary) hypertension: Secondary | ICD-10-CM

## 2018-06-08 DIAGNOSIS — H35341 Macular cyst, hole, or pseudohole, right eye: Secondary | ICD-10-CM

## 2018-06-08 DIAGNOSIS — H35033 Hypertensive retinopathy, bilateral: Secondary | ICD-10-CM

## 2018-06-08 DIAGNOSIS — H43813 Vitreous degeneration, bilateral: Secondary | ICD-10-CM

## 2018-09-18 DIAGNOSIS — L821 Other seborrheic keratosis: Secondary | ICD-10-CM | POA: Diagnosis not present

## 2018-09-18 DIAGNOSIS — Z85828 Personal history of other malignant neoplasm of skin: Secondary | ICD-10-CM | POA: Diagnosis not present

## 2018-09-18 DIAGNOSIS — C44519 Basal cell carcinoma of skin of other part of trunk: Secondary | ICD-10-CM | POA: Diagnosis not present

## 2018-09-18 DIAGNOSIS — Z08 Encounter for follow-up examination after completed treatment for malignant neoplasm: Secondary | ICD-10-CM | POA: Diagnosis not present

## 2018-10-09 DIAGNOSIS — Z23 Encounter for immunization: Secondary | ICD-10-CM | POA: Diagnosis not present

## 2018-11-10 DIAGNOSIS — J309 Allergic rhinitis, unspecified: Secondary | ICD-10-CM | POA: Diagnosis not present

## 2018-11-10 DIAGNOSIS — E669 Obesity, unspecified: Secondary | ICD-10-CM | POA: Diagnosis not present

## 2018-11-10 DIAGNOSIS — I129 Hypertensive chronic kidney disease with stage 1 through stage 4 chronic kidney disease, or unspecified chronic kidney disease: Secondary | ICD-10-CM | POA: Diagnosis not present

## 2018-11-10 DIAGNOSIS — E039 Hypothyroidism, unspecified: Secondary | ICD-10-CM | POA: Diagnosis not present

## 2018-11-10 DIAGNOSIS — E7800 Pure hypercholesterolemia, unspecified: Secondary | ICD-10-CM | POA: Diagnosis not present

## 2018-11-10 DIAGNOSIS — J45909 Unspecified asthma, uncomplicated: Secondary | ICD-10-CM | POA: Diagnosis not present

## 2018-11-10 DIAGNOSIS — N183 Chronic kidney disease, stage 3 unspecified: Secondary | ICD-10-CM | POA: Diagnosis not present

## 2018-11-10 DIAGNOSIS — Z0000 Encounter for general adult medical examination without abnormal findings: Secondary | ICD-10-CM | POA: Diagnosis not present

## 2019-02-07 DIAGNOSIS — E039 Hypothyroidism, unspecified: Secondary | ICD-10-CM | POA: Diagnosis not present

## 2019-02-25 ENCOUNTER — Emergency Department (HOSPITAL_BASED_OUTPATIENT_CLINIC_OR_DEPARTMENT_OTHER)
Admission: EM | Admit: 2019-02-25 | Discharge: 2019-02-25 | Disposition: A | Discharge status: Home / Self Care | Payer: Medicare Other | Attending: Emergency Medicine | Admitting: Emergency Medicine

## 2019-02-25 ENCOUNTER — Encounter (HOSPITAL_BASED_OUTPATIENT_CLINIC_OR_DEPARTMENT_OTHER): Payer: Self-pay | Admitting: Radiology

## 2019-02-25 ENCOUNTER — Emergency Department (HOSPITAL_BASED_OUTPATIENT_CLINIC_OR_DEPARTMENT_OTHER): Payer: Medicare Other

## 2019-02-25 ENCOUNTER — Other Ambulatory Visit: Payer: Self-pay

## 2019-02-25 DIAGNOSIS — J45909 Unspecified asthma, uncomplicated: Secondary | ICD-10-CM | POA: Diagnosis not present

## 2019-02-25 DIAGNOSIS — E279 Disorder of adrenal gland, unspecified: Secondary | ICD-10-CM | POA: Insufficient documentation

## 2019-02-25 DIAGNOSIS — R1032 Left lower quadrant pain: Secondary | ICD-10-CM | POA: Diagnosis not present

## 2019-02-25 DIAGNOSIS — E278 Other specified disorders of adrenal gland: Secondary | ICD-10-CM | POA: Diagnosis not present

## 2019-02-25 DIAGNOSIS — I10 Essential (primary) hypertension: Secondary | ICD-10-CM | POA: Diagnosis not present

## 2019-02-25 DIAGNOSIS — K5730 Diverticulosis of large intestine without perforation or abscess without bleeding: Secondary | ICD-10-CM | POA: Diagnosis not present

## 2019-02-25 DIAGNOSIS — E039 Hypothyroidism, unspecified: Secondary | ICD-10-CM | POA: Diagnosis not present

## 2019-02-25 DIAGNOSIS — K8020 Calculus of gallbladder without cholecystitis without obstruction: Secondary | ICD-10-CM | POA: Diagnosis not present

## 2019-02-25 LAB — CBC WITH DIFFERENTIAL/PLATELET
Abs Immature Granulocytes: 0.04 10*3/uL (ref 0.00–0.07)
Basophils Absolute: 0.1 10*3/uL (ref 0.0–0.1)
Basophils Relative: 1 %
Eosinophils Absolute: 0.4 10*3/uL (ref 0.0–0.5)
Eosinophils Relative: 4 %
HCT: 43.9 % (ref 36.0–46.0)
Hemoglobin: 14.4 g/dL (ref 12.0–15.0)
Immature Granulocytes: 0 %
Lymphocytes Relative: 24 %
Lymphs Abs: 2.4 10*3/uL (ref 0.7–4.0)
MCH: 28.6 pg (ref 26.0–34.0)
MCHC: 32.8 g/dL (ref 30.0–36.0)
MCV: 87.3 fL (ref 80.0–100.0)
Monocytes Absolute: 0.9 10*3/uL (ref 0.1–1.0)
Monocytes Relative: 9 %
Neutro Abs: 6.3 10*3/uL (ref 1.7–7.7)
Neutrophils Relative %: 62 %
Platelets: 204 10*3/uL (ref 150–400)
RBC: 5.03 MIL/uL (ref 3.87–5.11)
RDW: 12.8 % (ref 11.5–15.5)
WBC: 10.1 10*3/uL (ref 4.0–10.5)
nRBC: 0 % (ref 0.0–0.2)

## 2019-02-25 LAB — URINALYSIS, ROUTINE W REFLEX MICROSCOPIC
Bilirubin Urine: NEGATIVE
Glucose, UA: NEGATIVE mg/dL
Hgb urine dipstick: NEGATIVE
Ketones, ur: NEGATIVE mg/dL
Nitrite: NEGATIVE
Protein, ur: NEGATIVE mg/dL
Specific Gravity, Urine: 1.01 (ref 1.005–1.030)
pH: 7.5 (ref 5.0–8.0)

## 2019-02-25 LAB — COMPREHENSIVE METABOLIC PANEL
ALT: 13 U/L (ref 0–44)
AST: 16 U/L (ref 15–41)
Albumin: 4 g/dL (ref 3.5–5.0)
Alkaline Phosphatase: 53 U/L (ref 38–126)
Anion gap: 7 (ref 5–15)
BUN: 17 mg/dL (ref 8–23)
CO2: 26 mmol/L (ref 22–32)
Calcium: 9.5 mg/dL (ref 8.9–10.3)
Chloride: 101 mmol/L (ref 98–111)
Creatinine, Ser: 1.02 mg/dL — ABNORMAL HIGH (ref 0.44–1.00)
GFR calc Af Amer: >60 mL/min (ref >60)
GFR calc non Af Amer: 54 mL/min — ABNORMAL LOW (ref >60)
Glucose, Bld: 96 mg/dL (ref 70–99)
Potassium: 3.5 mmol/L (ref 3.5–5.1)
Sodium: 134 mmol/L — ABNORMAL LOW (ref 135–145)
Total Bilirubin: 0.9 mg/dL (ref 0.3–1.2)
Total Protein: 7.3 g/dL (ref 6.5–8.1)

## 2019-02-25 LAB — URINALYSIS, MICROSCOPIC (REFLEX)

## 2019-02-25 LAB — LIPASE, BLOOD: Lipase: 34 U/L (ref 11–51)

## 2019-02-25 MED ORDER — IOHEXOL 300 MG/ML  SOLN
100.0000 mL | Freq: Once | INTRAMUSCULAR | Status: AC | PRN
  Administered 2019-02-25: 100 mL via INTRAVENOUS

## 2019-02-25 NOTE — Discharge Instructions (Addendum)
Use a clear liquid diet for the next 2 days to see if this helps your symptoms.  Follow-up with your doctor on Monday or Tuesday for reassessment.  You have a growth on your right adrenal gland which needs further assessment that can be arranged by your primary care doctor.  The radiologist is recommending an MRI to better assess the area.  Discussed this with your primary care doctor.  Return to the emergency room if you have worsening symptoms including worsening pain, fevers, vomiting or other worsening symptoms.

## 2019-02-25 NOTE — ED Provider Notes (Signed)
Emergency Department Provider Note   I have reviewed the triage vital signs and the nursing notes.   HISTORY  Chief Complaint Abdominal Pain   HPI Krystal Ward is a 74 y.o. female with PMH of asthma and HTN presents to the emergency department for evaluation of acute onset of left back/flank pain now radiating to the left lower abdomen.  Symptoms been present for the past week and she describes it as a dull "annoying" pain.  No severe/sharp pain.  She has not had to take medication for this.  She states initially the pain in her back began with a forceful cough and that the back pain is mostly resolved at this point.  She does have some pain into the left leg but that is also resolved.  She has no dysuria, hesitancy, urgency.  No fevers or chills.  No diarrhea or vomiting.  Patient has been suffering from constipation.  With symptoms not improving over the past 8 days she presents to the emergency department for evaluation. No sudden worsening or change today. No history of kidney stones.    Past Medical History:  Diagnosis Date  . Asthma   . Family history of anesthesia complication    Mother has N/V  . GERD (gastroesophageal reflux disease)    tums prn  . Hypertension   . Hypothyroidism     Patient Active Problem List   Diagnosis Date Noted  . Nasal polyps 11/22/2013    Class: Chronic    Past Surgical History:  Procedure Laterality Date  . BREAST SURGERY     right breast biopsy  . EYE SURGERY Bilateral    bil Restore Lens  . NASAL POLYP SURGERY  2010  . POLYPECTOMY Bilateral 11/22/2013   Procedure: POLYPECTOMY NASAL;  Surgeon: Jerrell Belfast, MD;  Location: [facility];  Service: ENT;  Laterality: Bilateral;  . SINUS ENDO W/FUSION Bilateral 11/22/2013   Procedure: REVISION BILATERAL ENDOSCOPIC SINUS SURGERY;  Surgeon: Jerrell Belfast, MD;  Location: [place] [facility];  Service: ENT;  Laterality: Bilateral;  . THYROID SURGERY  1970's    Removed 3/4    Allergies Patient has  no allergy information on record.  No family history on file.  Social History Social History   Tobacco Use  . Smoking status: Never Smoker  Substance Use Topics  . Alcohol use: Yes    Alcohol/week: 2.0 standard drinks    Types: 2 drink(s) per week  . Drug use: No    Review of Systems  Constitutional: No fever/chills Eyes: No visual changes. ENT: No sore throat. Cardiovascular: Denies chest pain. Respiratory: Denies shortness of breath. Gastrointestinal: LLQ abdominal pain.  No nausea, no vomiting.  No diarrhea. Positive constipation. Genitourinary: Negative for dysuria. Musculoskeletal: Positive back pain (left) resolved.  Skin: Negative for rash. Neurological: Negative for headaches, focal weakness or numbness.  10-point ROS otherwise negative.  ____________________________________________   PHYSICAL EXAM:  VITAL SIGNS: ED Triage Vitals  Enc Vitals Group     BP 02/25/19 1351 (!) 204/73     Pulse Rate 02/25/19 1347 70     Resp 02/25/19 1347 18     Temp 02/25/19 1351 98.4 F (36.9 C)     Temp Source 02/25/19 1351 Oral     SpO2 02/25/19 1347 98 %     Weight 02/25/19 1347 197 lb (89.4 kg)     Height 02/25/19 1347 5\' 6"  (1.676 m)   Constitutional: Alert and oriented. Well appearing and in no acute distress. Eyes: Conjunctivae are  normal.  Head: Atraumatic. Nose: No congestion/rhinnorhea. Mouth/Throat: Mucous membranes are moist.   Neck: No stridor.   Cardiovascular: Normal rate, regular rhythm. Good peripheral circulation. Grossly normal heart sounds.   Respiratory: Normal respiratory effort.  No retractions. Lungs CTAB. Gastrointestinal: Soft with mild LLQ pain. No rebound or guarding. No distention.  Musculoskeletal: No gross deformities of extremities. Ambulatory without difficulty.  Neurologic:  Normal speech and language.  Skin:  Skin is warm, dry and intact. No rash noted. ____________________________________________   LABS (all labs ordered are  listed, but only abnormal results are displayed)  Labs Reviewed  URINALYSIS, ROUTINE W REFLEX MICROSCOPIC - Abnormal; Notable for the following components:      Result Value   Leukocytes,Ua TRACE (*)    All other components within normal limits  COMPREHENSIVE METABOLIC PANEL - Abnormal; Notable for the following components:   Sodium 134 (*)    Creatinine, Ser 1.02 (*)    GFR calc non Af Amer 54 (*)    All other components within normal limits  URINALYSIS, MICROSCOPIC (REFLEX) - Abnormal; Notable for the following components:   Bacteria, UA RARE (*)    All other components within normal limits  URINE CULTURE  LIPASE, BLOOD  CBC WITH DIFFERENTIAL/PLATELET   ____________________________________________  RADIOLOGY  CT ABDOMEN PELVIS W CONTRAST  Result Date: 02/25/2019 CLINICAL DATA:  Left lower quadrant abdominal pain. Dull ache near bladder interiorly without urinary symptoms. EXAM: CT ABDOMEN AND PELVIS WITH CONTRAST TECHNIQUE: Multidetector CT imaging of the abdomen and pelvis was performed using the standard protocol following bolus administration of intravenous contrast. CONTRAST:  165mL OMNIPAQUE IOHEXOL 300 MG/ML  SOLN COMPARISON:  None. FINDINGS: Lower chest: The lung bases are clear. The heart size is normal. Hepatobiliary: The liver is normal. Cholelithiasis without acute inflammation. There are areas of adenomyomatosis involving the gallbladder fundus. There may be some very mild intrahepatic biliary ductal dilatation. Pancreas: Normal contours without ductal dilatation. No peripancreatic fluid collection. Spleen: No splenic laceration or hematoma. Adrenals/Urinary Tract: --Adrenal glands: There is a complex right adrenal mass measuring approximately 3.8 x 2.8 cm. This mass appears to contain areas of fat. The left adrenal gland is unremarkable. --Right kidney/ureter: No hydronephrosis or perinephric hematoma. --Left kidney/ureter: No hydronephrosis or perinephric hematoma. --Urinary  bladder: Unremarkable. Stomach/Bowel: --Stomach/Duodenum: No hiatal hernia or other gastric abnormality. Normal duodenal course and caliber. --Small bowel: No dilatation or inflammation. --Colon: Rectosigmoid diverticulosis without acute inflammation. --Appendix: Normal. Vascular/Lymphatic: Atherosclerotic calcification is present within the non-aneurysmal abdominal aorta, without hemodynamically significant stenosis. --No retroperitoneal lymphadenopathy. --No mesenteric lymphadenopathy. --No pelvic or inguinal lymphadenopathy. Reproductive: Unremarkable Other: No ascites or free air. The abdominal wall is normal. Musculoskeletal. No acute displaced fractures. IMPRESSION: 1. Rectosigmoid diverticulosis without evidence of acute diverticulitis. 2. There is a complex right adrenal mass measuring up to 3.8 cm. This mass is incompletely characterized on this exam. Follow-up with a nonemergent outpatient adrenal mass protocol MRI is recommended for further evaluation. 3. Cholelithiasis without evidence of acute cholecystitis. 4.  Aortic Atherosclerosis (ICD10-I70.0). Electronically Signed   By: Constance Holster M.D.   On: 02/25/2019 15:44    ____________________________________________   PROCEDURES  Procedure(s) performed:   Procedures  None ____________________________________________   INITIAL IMPRESSION / ASSESSMENT AND PLAN / ED COURSE  Pertinent labs & imaging results that were available during my care of the patient were reviewed by me and considered in my medical decision making (see chart for details).   Patient presents to the emergency department for evaluation of  left lower quadrant abdominal pain and constipation.  Pain began with\flank discomfort which is since resolved.  Mild to moderate pain in left lower quadrant at this time.  Mild tenderness with no voluntary guarding or rebound.  Given the patient's age plan for CT imaging of this area to evaluate for possible diverticulitis  versus ureteral stone.  My suspicion for vascular pathology is exceedingly low.  Patient is very comfortable and ambulatory.  Neurologic exam is grossly intact.  Patient does have elevated blood pressure in triage but no apparent signs/symptoms to suspect hypertensive emergency.  She is compliant with her losartan and metoprolol.  Will follow with her PCP regarding further blood pressure management as an outpatient.   Labs reviewed. No acute findings. CT pending. Care transferred to Dr. Tamera Punt pending CT.  ____________________________________________  FINAL CLINICAL IMPRESSION(S) / ED DIAGNOSES  Final diagnoses:  Left lower quadrant abdominal pain  Right adrenal mass ([place])     MEDICATIONS GIVEN DURING THIS VISIT:  Medications  iohexol (OMNIPAQUE) 300 MG/ML solution 100 mL (100 mLs Intravenous Contrast Given 02/25/19 1524)    Note:  This document was prepared using Dragon voice recognition software and may include unintentional dictation errors.  Nanda Quinton, MD, [facility]    Jeannene Tschetter, [facility], MD 02/25/19 [phone]

## 2019-02-25 NOTE — ED Provider Notes (Signed)
Patient care was taken over from Dr. Laverta Baltimore.  Patient presented with discomfort in her left lower quadrant for about a week with associated nausea.  Her labs are nonconcerning.  Her urine does not appear infected.  She had a CT scan which showed cholelithiasis but no evidence of cholecystitis.  She is not having any pain around her gallbladder and I do not feel that this is the etiology of her symptoms.  There is diverticulosis but no suggestion of diverticulitis.  She does have a right adrenal mass which will need outpatient follow-up to better characterize.  I discussed this with the patient.  Her abdominal exam is benign.  She was discharged home in good condition.  I encouraged her to use a clear liquid diet for the next couple of days and follow-up with her primary care provider on Monday or Tuesday for reassessment and to discuss further evaluation of the right adrenal mass.  Return precautions were given.  Results for orders placed or performed during the hospital encounter of 02/25/19  Urinalysis, Routine w reflex microscopic  Result Value Ref Range   Color, Urine YELLOW YELLOW   APPearance CLEAR CLEAR   Specific Gravity, Urine 1.010 1.005 - 1.030   pH 7.5 5.0 - 8.0   Glucose, UA NEGATIVE NEGATIVE mg/dL   Hgb urine dipstick NEGATIVE NEGATIVE   Bilirubin Urine NEGATIVE NEGATIVE   Ketones, ur NEGATIVE NEGATIVE mg/dL   Protein, ur NEGATIVE NEGATIVE mg/dL   Nitrite NEGATIVE NEGATIVE   Leukocytes,Ua TRACE (A) NEGATIVE  Comprehensive metabolic panel  Result Value Ref Range   Sodium 134 (L) 135 - 145 mmol/L   Potassium 3.5 3.5 - 5.1 mmol/L   Chloride 101 98 - 111 mmol/L   CO2 26 22 - 32 mmol/L   Glucose, Bld 96 70 - 99 mg/dL   BUN 17 8 - 23 mg/dL   Creatinine, Ser 1.02 (H) 0.44 - 1.00 mg/dL   Calcium 9.5 8.9 - 10.3 mg/dL   Total Protein 7.3 6.5 - 8.1 g/dL   Albumin 4.0 3.5 - 5.0 g/dL   AST 16 15 - 41 U/L   ALT 13 0 - 44 U/L   Alkaline Phosphatase 53 38 - 126 U/L   Total Bilirubin 0.9  0.3 - 1.2 mg/dL   GFR calc non Af Amer 54 (L) >60 mL/min   GFR calc Af Amer >60 >60 mL/min   Anion gap 7 5 - 15  Lipase, blood  Result Value Ref Range   Lipase 34 11 - 51 U/L  CBC with Differential  Result Value Ref Range   WBC 10.1 4.0 - 10.5 K/uL   RBC 5.03 3.87 - 5.11 MIL/uL   Hemoglobin 14.4 12.0 - 15.0 g/dL   HCT 43.9 36.0 - 46.0 %   MCV 87.3 80.0 - 100.0 fL   MCH 28.6 26.0 - 34.0 pg   MCHC 32.8 30.0 - 36.0 g/dL   RDW 12.8 11.5 - 15.5 %   Platelets 204 150 - 400 K/uL   nRBC 0.0 0.0 - 0.2 %   Neutrophils Relative % 62 %   Neutro Abs 6.3 1.7 - 7.7 K/uL   Lymphocytes Relative 24 %   Lymphs Abs 2.4 0.7 - 4.0 K/uL   Monocytes Relative 9 %   Monocytes Absolute 0.9 0.1 - 1.0 K/uL   Eosinophils Relative 4 %   Eosinophils Absolute 0.4 0.0 - 0.5 K/uL   Basophils Relative 1 %   Basophils Absolute 0.1 0.0 - 0.1 K/uL  Immature Granulocytes 0 %   Abs Immature Granulocytes 0.04 0.00 - 0.07 K/uL  Urinalysis, Microscopic (reflex)  Result Value Ref Range   RBC / HPF 0-5 0 - 5 RBC/hpf   WBC, UA 0-5 0 - 5 WBC/hpf   Bacteria, UA RARE (A) NONE SEEN   Squamous Epithelial / LPF 0-5 0 - 5   CT ABDOMEN PELVIS W CONTRAST  Result Date: 02/25/2019 CLINICAL DATA:  Left lower quadrant abdominal pain. Dull ache near bladder interiorly without urinary symptoms. EXAM: CT ABDOMEN AND PELVIS WITH CONTRAST TECHNIQUE: Multidetector CT imaging of the abdomen and pelvis was performed using the standard protocol following bolus administration of intravenous contrast. CONTRAST:  110mL OMNIPAQUE IOHEXOL 300 MG/ML  SOLN COMPARISON:  None. FINDINGS: Lower chest: The lung bases are clear. The heart size is normal. Hepatobiliary: The liver is normal. Cholelithiasis without acute inflammation. There are areas of adenomyomatosis involving the gallbladder fundus. There may be some very mild intrahepatic biliary ductal dilatation. Pancreas: Normal contours without ductal dilatation. No peripancreatic fluid collection.  Spleen: No splenic laceration or hematoma. Adrenals/Urinary Tract: --Adrenal glands: There is a complex right adrenal mass measuring approximately 3.8 x 2.8 cm. This mass appears to contain areas of fat. The left adrenal gland is unremarkable. --Right kidney/ureter: No hydronephrosis or perinephric hematoma. --Left kidney/ureter: No hydronephrosis or perinephric hematoma. --Urinary bladder: Unremarkable. Stomach/Bowel: --Stomach/Duodenum: No hiatal hernia or other gastric abnormality. Normal duodenal course and caliber. --Small bowel: No dilatation or inflammation. --Colon: Rectosigmoid diverticulosis without acute inflammation. --Appendix: Normal. Vascular/Lymphatic: Atherosclerotic calcification is present within the non-aneurysmal abdominal aorta, without hemodynamically significant stenosis. --No retroperitoneal lymphadenopathy. --No mesenteric lymphadenopathy. --No pelvic or inguinal lymphadenopathy. Reproductive: Unremarkable Other: No ascites or free air. The abdominal wall is normal. Musculoskeletal. No acute displaced fractures. IMPRESSION: 1. Rectosigmoid diverticulosis without evidence of acute diverticulitis. 2. There is a complex right adrenal mass measuring up to 3.8 cm. This mass is incompletely characterized on this exam. Follow-up with a nonemergent outpatient adrenal mass protocol MRI is recommended for further evaluation. 3. Cholelithiasis without evidence of acute cholecystitis. 4.  Aortic Atherosclerosis (ICD10-I70.0). Electronically Signed   By: Constance Holster M.D.   On: 02/25/2019 15:44      Malvin Johns, MD 02/25/19 [phone]

## 2019-02-25 NOTE — ED Triage Notes (Signed)
Pt c/o LLQ pain x 1 week. Pt also c/o nausea. Pt reports sneezing 1 week ago and having pain in back and lower quadrant. Pt also reports difficulty with BM. Pt denies fever

## 2019-02-26 LAB — URINE CULTURE

## 2019-03-18 ENCOUNTER — Ambulatory Visit: Payer: Medicare Other | Attending: Internal Medicine

## 2019-03-18 DIAGNOSIS — Z23 Encounter for immunization: Secondary | ICD-10-CM

## 2019-03-18 NOTE — Progress Notes (Signed)
   Covid-19 Vaccination Clinic  Name:  Krystal Ward    MRN: [id number] DOB: Jul 28, 1945  03/18/2019  Krystal Ward was observed post Covid-19 immunization for 15 minutes without incidence. She was provided with Vaccine Information Sheet and instruction to access the V-Safe system.   Krystal Ward was instructed to call 911 with any severe reactions post vaccine: Marland Kitchen Difficulty breathing  . Swelling of your face and throat  . A fast heartbeat  . A bad rash all over your body  . Dizziness and weakness    Immunizations Administered    Name Date Dose VIS Date Route   Pfizer COVID-19 Vaccine 03/18/2019  1:47 PM 0.3 mL 01/12/2019 Intramuscular   Manufacturer: [place]   Lot: [id number]   [place]: [id number]

## 2019-04-10 ENCOUNTER — Ambulatory Visit: Payer: Medicare Other | Attending: Internal Medicine

## 2019-04-10 ENCOUNTER — Ambulatory Visit: Payer: Medicare Other

## 2019-04-10 DIAGNOSIS — Z23 Encounter for immunization: Secondary | ICD-10-CM | POA: Insufficient documentation

## 2019-04-10 NOTE — Progress Notes (Signed)
   Covid-19 Vaccination Clinic  Name:  Krystal Ward    MRN: [id number] DOB: [birth year]  04/10/2019  [name] was observed post Covid-19 immunization for 15 minutes without incident. She was provided with Vaccine Information Sheet and instruction to access the V-Safe system.   Ms. Counter was instructed to call 911 with any severe reactions post vaccine: Marland Kitchen Difficulty breathing  . Swelling of face and throat  . A fast heartbeat  . A bad rash all over body  . Dizziness and weakness   Immunizations Administered    Name Date Dose VIS Date Route   Pfizer COVID-19 Vaccine 04/10/2019  2:45 PM 0.3 mL 01/12/2019 Intramuscular   Manufacturer: [place]   Lot: [id number]   [place]: [id number]

## 2019-04-11 ENCOUNTER — Ambulatory Visit: Payer: Medicare Other

## 2019-05-11 DIAGNOSIS — E669 Obesity, unspecified: Secondary | ICD-10-CM | POA: Diagnosis not present

## 2019-05-11 DIAGNOSIS — N183 Chronic kidney disease, stage 3 unspecified: Secondary | ICD-10-CM | POA: Diagnosis not present

## 2019-05-11 DIAGNOSIS — E039 Hypothyroidism, unspecified: Secondary | ICD-10-CM | POA: Diagnosis not present

## 2019-05-11 DIAGNOSIS — J45909 Unspecified asthma, uncomplicated: Secondary | ICD-10-CM | POA: Diagnosis not present

## 2019-05-11 DIAGNOSIS — E7800 Pure hypercholesterolemia, unspecified: Secondary | ICD-10-CM | POA: Diagnosis not present

## 2019-05-11 DIAGNOSIS — I129 Hypertensive chronic kidney disease with stage 1 through stage 4 chronic kidney disease, or unspecified chronic kidney disease: Secondary | ICD-10-CM | POA: Diagnosis not present

## 2019-05-15 ENCOUNTER — Other Ambulatory Visit (HOSPITAL_BASED_OUTPATIENT_CLINIC_OR_DEPARTMENT_OTHER): Payer: Self-pay | Admitting: Family Medicine

## 2019-05-15 DIAGNOSIS — E278 Other specified disorders of adrenal gland: Secondary | ICD-10-CM

## 2019-05-19 ENCOUNTER — Ambulatory Visit (HOSPITAL_BASED_OUTPATIENT_CLINIC_OR_DEPARTMENT_OTHER)
Admission: RE | Admit: 2019-05-19 | Discharge: 2019-05-19 | Disposition: A | Discharge status: Home / Self Care | Payer: Medicare Other | Source: Ambulatory Visit | Attending: Family Medicine | Admitting: Family Medicine

## 2019-05-19 ENCOUNTER — Other Ambulatory Visit: Payer: Self-pay

## 2019-05-19 DIAGNOSIS — E278 Other specified disorders of adrenal gland: Secondary | ICD-10-CM | POA: Diagnosis not present

## 2019-05-19 DIAGNOSIS — K8020 Calculus of gallbladder without cholecystitis without obstruction: Secondary | ICD-10-CM | POA: Diagnosis not present

## 2019-05-19 MED ORDER — GADOBUTROL 1 MMOL/ML IV SOLN
8.9000 mL | Freq: Once | INTRAVENOUS | Status: AC | PRN
  Administered 2019-05-19: 18:00:00 8.9 mL via INTRAVENOUS

## 2019-06-11 ENCOUNTER — Other Ambulatory Visit: Payer: Self-pay

## 2019-06-11 ENCOUNTER — Encounter (INDEPENDENT_AMBULATORY_CARE_PROVIDER_SITE_OTHER): Payer: Medicare Other | Admitting: Ophthalmology

## 2019-06-11 DIAGNOSIS — H43813 Vitreous degeneration, bilateral: Secondary | ICD-10-CM

## 2019-06-11 DIAGNOSIS — I10 Essential (primary) hypertension: Secondary | ICD-10-CM

## 2019-06-11 DIAGNOSIS — H353122 Nonexudative age-related macular degeneration, left eye, intermediate dry stage: Secondary | ICD-10-CM | POA: Diagnosis not present

## 2019-06-11 DIAGNOSIS — H35033 Hypertensive retinopathy, bilateral: Secondary | ICD-10-CM

## 2019-06-11 DIAGNOSIS — H35341 Macular cyst, hole, or pseudohole, right eye: Secondary | ICD-10-CM | POA: Diagnosis not present

## 2019-06-14 DIAGNOSIS — Z1231 Encounter for screening mammogram for malignant neoplasm of breast: Secondary | ICD-10-CM | POA: Diagnosis not present

## 2019-07-09 DIAGNOSIS — E039 Hypothyroidism, unspecified: Secondary | ICD-10-CM | POA: Diagnosis not present

## 2019-09-21 DIAGNOSIS — Z23 Encounter for immunization: Secondary | ICD-10-CM | POA: Diagnosis not present

## 2019-11-02 DIAGNOSIS — Z1211 Encounter for screening for malignant neoplasm of colon: Secondary | ICD-10-CM | POA: Diagnosis not present

## 2019-11-02 DIAGNOSIS — E039 Hypothyroidism, unspecified: Secondary | ICD-10-CM | POA: Diagnosis not present

## 2019-11-02 DIAGNOSIS — Z0000 Encounter for general adult medical examination without abnormal findings: Secondary | ICD-10-CM | POA: Diagnosis not present

## 2019-11-02 DIAGNOSIS — J309 Allergic rhinitis, unspecified: Secondary | ICD-10-CM | POA: Diagnosis not present

## 2019-11-02 DIAGNOSIS — E7800 Pure hypercholesterolemia, unspecified: Secondary | ICD-10-CM | POA: Diagnosis not present

## 2019-11-02 DIAGNOSIS — N183 Chronic kidney disease, stage 3 unspecified: Secondary | ICD-10-CM | POA: Diagnosis not present

## 2019-11-02 DIAGNOSIS — I129 Hypertensive chronic kidney disease with stage 1 through stage 4 chronic kidney disease, or unspecified chronic kidney disease: Secondary | ICD-10-CM | POA: Diagnosis not present

## 2019-11-07 DIAGNOSIS — Z1211 Encounter for screening for malignant neoplasm of colon: Secondary | ICD-10-CM | POA: Diagnosis not present

## 2019-11-15 DIAGNOSIS — Z23 Encounter for immunization: Secondary | ICD-10-CM | POA: Diagnosis not present

## 2020-03-14 DIAGNOSIS — Z01419 Encounter for gynecological examination (general) (routine) without abnormal findings: Secondary | ICD-10-CM | POA: Diagnosis not present

## 2020-03-25 DIAGNOSIS — E7800 Pure hypercholesterolemia, unspecified: Secondary | ICD-10-CM | POA: Diagnosis not present

## 2020-03-25 DIAGNOSIS — J45909 Unspecified asthma, uncomplicated: Secondary | ICD-10-CM | POA: Diagnosis not present

## 2020-03-25 DIAGNOSIS — N183 Chronic kidney disease, stage 3 unspecified: Secondary | ICD-10-CM | POA: Diagnosis not present

## 2020-03-25 DIAGNOSIS — I129 Hypertensive chronic kidney disease with stage 1 through stage 4 chronic kidney disease, or unspecified chronic kidney disease: Secondary | ICD-10-CM | POA: Diagnosis not present

## 2020-03-25 DIAGNOSIS — E039 Hypothyroidism, unspecified: Secondary | ICD-10-CM | POA: Diagnosis not present

## 2020-05-07 DIAGNOSIS — E039 Hypothyroidism, unspecified: Secondary | ICD-10-CM | POA: Diagnosis not present

## 2020-05-07 DIAGNOSIS — I700 Atherosclerosis of aorta: Secondary | ICD-10-CM | POA: Diagnosis not present

## 2020-05-07 DIAGNOSIS — J309 Allergic rhinitis, unspecified: Secondary | ICD-10-CM | POA: Diagnosis not present

## 2020-05-07 DIAGNOSIS — N183 Chronic kidney disease, stage 3 unspecified: Secondary | ICD-10-CM | POA: Diagnosis not present

## 2020-05-07 DIAGNOSIS — J45909 Unspecified asthma, uncomplicated: Secondary | ICD-10-CM | POA: Diagnosis not present

## 2020-05-07 DIAGNOSIS — I129 Hypertensive chronic kidney disease with stage 1 through stage 4 chronic kidney disease, or unspecified chronic kidney disease: Secondary | ICD-10-CM | POA: Diagnosis not present

## 2020-05-26 DIAGNOSIS — C44519 Basal cell carcinoma of skin of other part of trunk: Secondary | ICD-10-CM | POA: Diagnosis not present

## 2020-05-26 DIAGNOSIS — L720 Epidermal cyst: Secondary | ICD-10-CM | POA: Diagnosis not present

## 2020-05-26 DIAGNOSIS — Z85828 Personal history of other malignant neoplasm of skin: Secondary | ICD-10-CM | POA: Diagnosis not present

## 2020-05-30 DIAGNOSIS — E039 Hypothyroidism, unspecified: Secondary | ICD-10-CM | POA: Diagnosis not present

## 2020-05-30 DIAGNOSIS — E7800 Pure hypercholesterolemia, unspecified: Secondary | ICD-10-CM | POA: Diagnosis not present

## 2020-05-30 DIAGNOSIS — N183 Chronic kidney disease, stage 3 unspecified: Secondary | ICD-10-CM | POA: Diagnosis not present

## 2020-05-30 DIAGNOSIS — J45909 Unspecified asthma, uncomplicated: Secondary | ICD-10-CM | POA: Diagnosis not present

## 2020-05-30 DIAGNOSIS — I129 Hypertensive chronic kidney disease with stage 1 through stage 4 chronic kidney disease, or unspecified chronic kidney disease: Secondary | ICD-10-CM | POA: Diagnosis not present

## 2020-06-10 ENCOUNTER — Other Ambulatory Visit: Payer: Self-pay

## 2020-06-10 ENCOUNTER — Encounter (INDEPENDENT_AMBULATORY_CARE_PROVIDER_SITE_OTHER): Payer: Medicare Other | Admitting: Ophthalmology

## 2020-06-10 DIAGNOSIS — H35371 Puckering of macula, right eye: Secondary | ICD-10-CM | POA: Diagnosis not present

## 2020-06-10 DIAGNOSIS — H43813 Vitreous degeneration, bilateral: Secondary | ICD-10-CM | POA: Diagnosis not present

## 2020-06-10 DIAGNOSIS — H353122 Nonexudative age-related macular degeneration, left eye, intermediate dry stage: Secondary | ICD-10-CM | POA: Diagnosis not present

## 2020-06-10 DIAGNOSIS — H35341 Macular cyst, hole, or pseudohole, right eye: Secondary | ICD-10-CM

## 2020-06-10 DIAGNOSIS — H35033 Hypertensive retinopathy, bilateral: Secondary | ICD-10-CM

## 2020-06-10 DIAGNOSIS — I10 Essential (primary) hypertension: Secondary | ICD-10-CM

## 2020-06-19 DIAGNOSIS — Z1231 Encounter for screening mammogram for malignant neoplasm of breast: Secondary | ICD-10-CM | POA: Diagnosis not present

## 2020-08-13 DIAGNOSIS — Z23 Encounter for immunization: Secondary | ICD-10-CM | POA: Diagnosis not present

## 2020-09-04 DIAGNOSIS — N183 Chronic kidney disease, stage 3 unspecified: Secondary | ICD-10-CM | POA: Diagnosis not present

## 2020-09-04 DIAGNOSIS — I129 Hypertensive chronic kidney disease with stage 1 through stage 4 chronic kidney disease, or unspecified chronic kidney disease: Secondary | ICD-10-CM | POA: Diagnosis not present

## 2020-09-04 DIAGNOSIS — J45909 Unspecified asthma, uncomplicated: Secondary | ICD-10-CM | POA: Diagnosis not present

## 2020-09-04 DIAGNOSIS — E7800 Pure hypercholesterolemia, unspecified: Secondary | ICD-10-CM | POA: Diagnosis not present

## 2020-09-04 DIAGNOSIS — E039 Hypothyroidism, unspecified: Secondary | ICD-10-CM | POA: Diagnosis not present

## 2020-09-17 DIAGNOSIS — L720 Epidermal cyst: Secondary | ICD-10-CM | POA: Diagnosis not present

## 2020-09-17 DIAGNOSIS — Z85828 Personal history of other malignant neoplasm of skin: Secondary | ICD-10-CM | POA: Diagnosis not present

## 2020-10-23 ENCOUNTER — Emergency Department (HOSPITAL_BASED_OUTPATIENT_CLINIC_OR_DEPARTMENT_OTHER)
Admission: EM | Admit: 2020-10-23 | Discharge: 2020-10-24 | Disposition: A | Discharge status: Home / Self Care | Payer: Medicare Other | Attending: Emergency Medicine | Admitting: Emergency Medicine

## 2020-10-23 ENCOUNTER — Other Ambulatory Visit: Payer: Self-pay

## 2020-10-23 ENCOUNTER — Emergency Department (HOSPITAL_BASED_OUTPATIENT_CLINIC_OR_DEPARTMENT_OTHER): Payer: Medicare Other

## 2020-10-23 ENCOUNTER — Encounter (HOSPITAL_BASED_OUTPATIENT_CLINIC_OR_DEPARTMENT_OTHER): Payer: Self-pay

## 2020-10-23 DIAGNOSIS — W19XXXA Unspecified fall, initial encounter: Secondary | ICD-10-CM | POA: Diagnosis not present

## 2020-10-23 DIAGNOSIS — E039 Hypothyroidism, unspecified: Secondary | ICD-10-CM | POA: Diagnosis not present

## 2020-10-23 DIAGNOSIS — M25511 Pain in right shoulder: Secondary | ICD-10-CM | POA: Diagnosis not present

## 2020-10-23 DIAGNOSIS — S4981XA Other specified injuries of right shoulder and upper arm, initial encounter: Secondary | ICD-10-CM | POA: Diagnosis not present

## 2020-10-23 DIAGNOSIS — I10 Essential (primary) hypertension: Secondary | ICD-10-CM | POA: Diagnosis not present

## 2020-10-23 DIAGNOSIS — J45909 Unspecified asthma, uncomplicated: Secondary | ICD-10-CM | POA: Diagnosis not present

## 2020-10-23 DIAGNOSIS — S4990XA Unspecified injury of shoulder and upper arm, unspecified arm, initial encounter: Secondary | ICD-10-CM | POA: Diagnosis not present

## 2020-10-23 DIAGNOSIS — S42201A Unspecified fracture of upper end of right humerus, initial encounter for closed fracture: Secondary | ICD-10-CM | POA: Diagnosis not present

## 2020-10-23 DIAGNOSIS — Z79899 Other long term (current) drug therapy: Secondary | ICD-10-CM | POA: Diagnosis not present

## 2020-10-23 DIAGNOSIS — S42291A Other displaced fracture of upper end of right humerus, initial encounter for closed fracture: Secondary | ICD-10-CM | POA: Insufficient documentation

## 2020-10-23 DIAGNOSIS — W1839XA Other fall on same level, initial encounter: Secondary | ICD-10-CM | POA: Diagnosis not present

## 2020-10-23 DIAGNOSIS — S4992XA Unspecified injury of left shoulder and upper arm, initial encounter: Secondary | ICD-10-CM | POA: Diagnosis present

## 2020-10-23 MED ORDER — HYDROCODONE-ACETAMINOPHEN 5-325 MG PO TABS
1.0000 | ORAL_TABLET | Freq: Once | ORAL | Status: AC
Start: 2020-10-23 — End: 2020-10-23
  Administered 2020-10-23: 1 via ORAL
  Filled 2020-10-23: qty 1

## 2020-10-23 NOTE — ED Triage Notes (Signed)
Pt brought in by [facility]-pt fell ~20 min PTA trying to keep husband from falling-pain to right shoulder/upper arm-to triage in w/c-NAD

## 2020-10-23 NOTE — ED Provider Notes (Signed)
[facility] EMERGENCY DEPARTMENT Provider Note   CSN: [id number] Arrival date & time: 10/23/20  1851     History Chief Complaint  Patient presents with   Krystal Ward is a 75 y.o. female.  The history is provided by the patient.  Fall Krystal Ward is a 75 y.o. female who presents to the Emergency Department complaining of fall.  She went to get her husband after he fell.  She went looking for some items that he had lost.  He started to fall an additional time and she fell attempting to catch him.  She landed on her right shoulder.  Did not hit head.  Has pain isolated to her right shoulder.  She is right hand dominant.  Walks without assistance.  Lives with husband.    No recent illnesses.  No recent fever, cough, sob, N/V.   Has a hx/o HTN.       Past Medical History:  Diagnosis Date   Asthma    Family history of anesthesia complication    Mother has N/V   GERD (gastroesophageal reflux disease)    tums prn   Hypertension    Hypothyroidism     Patient Active Problem List   Diagnosis Date Noted   Nasal polyps 11/22/2013    Class: Chronic    Past Surgical History:  Procedure Laterality Date   BREAST SURGERY     right breast biopsy   EYE SURGERY Bilateral    bil Restore Lens   NASAL POLYP SURGERY  2010   POLYPECTOMY Bilateral 11/22/2013   Procedure: POLYPECTOMY NASAL;  Surgeon: Jerrell Belfast, MD;  Location: [facility];  Service: ENT;  Laterality: Bilateral;   SINUS ENDO W/FUSION Bilateral 11/22/2013   Procedure: REVISION BILATERAL ENDOSCOPIC SINUS SURGERY;  Surgeon: Jerrell Belfast, MD;  Location: [place];  Service: ENT;  Laterality: Bilateral;   THYROID SURGERY  1970's    Removed 3/4     OB History   No obstetric history on file.     No family history on file.  Social History   Tobacco Use   Smoking status: Never   Smokeless tobacco: Never  Vaping Use   Vaping Use: Never used  Substance Use Topics   Alcohol use: Yes     Alcohol/week: 2.0 standard drinks    Types: 2 drink(s) per week    Comment: occ   Drug use: No    Home Medications Prior to Admission medications   Medication Sig Start Date End Date Taking? Authorizing Provider  HYDROcodone-acetaminophen (NORCO/VICODIN) 5-325 MG tablet Take 1 tablet by mouth every 8 (eight) hours as needed. 10/24/20  Yes Quintella Reichert, MD  amoxicillin-clavulanate (AUGMENTIN) 500-125 MG per tablet Take 1 tablet (500 mg total) by mouth 2 (two) times daily. 11/22/13   Jerrell Belfast, MD  Ascorbic Acid (VITAMIN C) 1000 MG tablet Take 1,000 mg by mouth daily.    [provider]  budesonide-formoterol (SYMBICORT) 160-4.5 MCG/ACT inhaler Inhale 2 puffs into the lungs 2 (two) times daily.    [provider]  Calcium Carb-Cholecalciferol (CALCIUM 600 + D PO) Take 1 tablet by mouth 2 (two) times daily.    [provider]  Cholecalciferol (VITAMIN D3) 1000 UNITS CAPS Take 1 capsule by mouth daily.    [provider]  Chromium Picolinate 1000 MCG TABS Take 1,000 mcg by mouth daily.    [provider]  FLOVENT [facility] 110 MCG/ACT inhaler  12/25/18   [provider]  fluticasone (FLONASE) 50 MCG/ACT nasal spray Place 2 sprays into both nostrils daily.    [provider]  levothyroxine (SYNTHROID) 125 MCG tablet SMARTSIG:3 Tablet(s) By Mouth Every Other Day PRN 02/08/19   [provider]  losartan (COZAAR) 100 MG tablet Take 100 mg by mouth daily.    [provider]  lovastatin (MEVACOR) 40 MG tablet Take 40 mg by mouth at bedtime.    [provider]  metoprolol succinate (TOPROL-XL) 50 MG 24 hr tablet Take 50 mg by mouth daily. Take with or immediately following a meal.    [provider]  triamterene-hydrochlorothiazide (DYAZIDE) 37.5-25 MG per capsule Take 1 capsule by mouth daily.    [provider]    Allergies    Patient has no known allergies.  Review of Systems   Review of  Systems  All other systems reviewed and are negative.  Physical Exam Updated Vital Signs BP (!) 155/59 (BP Location: Left Arm)   Pulse (!) 58   Temp 97.6 F (36.4 C) (Oral)   Resp 16   Ht 5\' 6"  (1.676 m)   Wt 88 kg   SpO2 100%   BMI 31.31 kg/m   Physical Exam Vitals and nursing note reviewed.  Constitutional:      Appearance: She is well-developed.  HENT:     Head: Normocephalic and atraumatic.  Cardiovascular:     Rate and Rhythm: Normal rate and regular rhythm.     Heart sounds: No murmur heard. Pulmonary:     Effort: Pulmonary effort is normal. No respiratory distress.     Breath sounds: Normal breath sounds.  Abdominal:     Palpations: Abdomen is soft.     Tenderness: There is no abdominal tenderness. There is no guarding or rebound.  Musculoskeletal:     Cervical back: No tenderness.     Comments: 2+ radial pulses bilaterally.  There is tenderness to palpation over the right upper arm.  No significant elbow/hand/wrist tenderness.  Small abrasion over right elbow.    Skin:    General: Skin is warm and dry.  Neurological:     Mental Status: She is alert and oriented to person, place, and time.  Psychiatric:        Behavior: Behavior normal.    ED Results / Procedures / Treatments   Labs (all labs ordered are listed, but only abnormal results are displayed) Labs Reviewed - No data to display  EKG None  Radiology DG Shoulder Right  Result Date: 10/23/2020 CLINICAL DATA:  Right shoulder pain after injury. EXAM: RIGHT SHOULDER - 2+ VIEW; RIGHT HUMERUS - 2+ VIEW COMPARISON:  None. FINDINGS: Transverse fractures of the right humeral neck with impaction and varus angulation of the proximal humerus. No obvious intra-articular involvement. No dislocation of the shoulder. Degenerative changes in the glenohumeral and acromioclavicular joints. The remainder of the right humerus in the visualized right elbow appear intact. Soft tissues are unremarkable. IMPRESSION:  Transverse fractures of the right humeral neck with impaction and varus angulation of the proximal humerus. Degenerative changes in the shoulder. Electronically Signed   By: Lucienne Capers M.D.   On: 10/23/2020 19:37   DG Humerus Right  Result Date: 10/23/2020 CLINICAL DATA:  Right shoulder pain after injury. EXAM: RIGHT SHOULDER - 2+ VIEW; RIGHT HUMERUS - 2+ VIEW COMPARISON:  None. FINDINGS: Transverse fractures of the right humeral neck with impaction and varus angulation of the proximal humerus. No obvious intra-articular involvement. No dislocation of the shoulder. Degenerative changes  in the glenohumeral and acromioclavicular joints. The remainder of the right humerus in the visualized right elbow appear intact. Soft tissues are unremarkable. IMPRESSION: Transverse fractures of the right humeral neck with impaction and varus angulation of the proximal humerus. Degenerative changes in the shoulder. Electronically Signed   By: Lucienne Capers M.D.   On: 10/23/2020 19:37    Procedures Procedures   Medications Ordered in ED Medications  HYDROcodone-acetaminophen (NORCO/VICODIN) 5-325 MG per tablet 1 tablet (1 tablet Oral Given 10/23/20 2347)    ED Course  I have reviewed the triage vital signs and the nursing notes.  Pertinent labs & imaging results that were available during my care of the patient were reviewed by me and considered in my medical decision making (see chart for details).    MDM Rules/Calculators/A&P                          patient here for evaluation of right shoulder pain following a mechanical fall. She does have local tenderness to palpation on examination with decreased range of motion. She is well perfused distally. Imaging is significant for proximal humerus fracture. No clinical evidence of additional injuries. Plan to place in sling with orthopedics follow-up and return precautions.  Final Clinical Impression(s) / ED Diagnoses Final diagnoses:  Other closed  displaced fracture of proximal end of right humerus, initial encounter    Rx / DC Orders ED Discharge Orders          Ordered    HYDROcodone-acetaminophen (NORCO/VICODIN) 5-325 MG tablet  Every 8 hours PRN        10/24/20 0011             Quintella Reichert, MD 10/24/20 [phone]

## 2020-10-24 MED ORDER — HYDROCODONE-ACETAMINOPHEN 5-325 MG PO TABS: 1.0000 | ORAL_TABLET | Freq: Three times a day (TID) | ORAL | 0 refills | Status: DC | PRN

## 2020-10-28 DIAGNOSIS — S42291A Other displaced fracture of upper end of right humerus, initial encounter for closed fracture: Secondary | ICD-10-CM | POA: Diagnosis not present

## 2020-11-05 DIAGNOSIS — Z1211 Encounter for screening for malignant neoplasm of colon: Secondary | ICD-10-CM | POA: Diagnosis not present

## 2020-11-05 DIAGNOSIS — I129 Hypertensive chronic kidney disease with stage 1 through stage 4 chronic kidney disease, or unspecified chronic kidney disease: Secondary | ICD-10-CM | POA: Diagnosis not present

## 2020-11-05 DIAGNOSIS — N1832 Chronic kidney disease, stage 3b: Secondary | ICD-10-CM | POA: Diagnosis not present

## 2020-11-05 DIAGNOSIS — Z23 Encounter for immunization: Secondary | ICD-10-CM | POA: Diagnosis not present

## 2020-11-05 DIAGNOSIS — J45909 Unspecified asthma, uncomplicated: Secondary | ICD-10-CM | POA: Diagnosis not present

## 2020-11-05 DIAGNOSIS — E039 Hypothyroidism, unspecified: Secondary | ICD-10-CM | POA: Diagnosis not present

## 2020-11-05 DIAGNOSIS — E7800 Pure hypercholesterolemia, unspecified: Secondary | ICD-10-CM | POA: Diagnosis not present

## 2020-11-05 DIAGNOSIS — Z0000 Encounter for general adult medical examination without abnormal findings: Secondary | ICD-10-CM | POA: Diagnosis not present

## 2020-11-05 DIAGNOSIS — I700 Atherosclerosis of aorta: Secondary | ICD-10-CM | POA: Diagnosis not present

## 2020-11-11 ENCOUNTER — Other Ambulatory Visit (HOSPITAL_BASED_OUTPATIENT_CLINIC_OR_DEPARTMENT_OTHER): Payer: Self-pay | Admitting: Family Medicine

## 2020-11-11 DIAGNOSIS — S42291D Other displaced fracture of upper end of right humerus, subsequent encounter for fracture with routine healing: Secondary | ICD-10-CM | POA: Diagnosis not present

## 2020-11-12 ENCOUNTER — Other Ambulatory Visit (HOSPITAL_BASED_OUTPATIENT_CLINIC_OR_DEPARTMENT_OTHER): Payer: Self-pay | Admitting: Family Medicine

## 2020-11-12 DIAGNOSIS — Z87310 Personal history of (healed) osteoporosis fracture: Secondary | ICD-10-CM

## 2020-11-12 DIAGNOSIS — Z13820 Encounter for screening for osteoporosis: Secondary | ICD-10-CM

## 2020-11-12 DIAGNOSIS — S42301A Unspecified fracture of shaft of humerus, right arm, initial encounter for closed fracture: Secondary | ICD-10-CM

## 2020-11-14 ENCOUNTER — Other Ambulatory Visit (HOSPITAL_BASED_OUTPATIENT_CLINIC_OR_DEPARTMENT_OTHER): Payer: Self-pay | Admitting: Family Medicine

## 2020-11-14 DIAGNOSIS — Z13820 Encounter for screening for osteoporosis: Secondary | ICD-10-CM

## 2020-11-18 DIAGNOSIS — M6281 Muscle weakness (generalized): Secondary | ICD-10-CM | POA: Diagnosis not present

## 2020-11-18 DIAGNOSIS — M25511 Pain in right shoulder: Secondary | ICD-10-CM | POA: Diagnosis not present

## 2020-11-18 DIAGNOSIS — R293 Abnormal posture: Secondary | ICD-10-CM | POA: Diagnosis not present

## 2020-11-18 DIAGNOSIS — M25611 Stiffness of right shoulder, not elsewhere classified: Secondary | ICD-10-CM | POA: Diagnosis not present

## 2020-11-18 DIAGNOSIS — M25411 Effusion, right shoulder: Secondary | ICD-10-CM | POA: Diagnosis not present

## 2020-11-19 DIAGNOSIS — M25411 Effusion, right shoulder: Secondary | ICD-10-CM | POA: Diagnosis not present

## 2020-11-19 DIAGNOSIS — M25611 Stiffness of right shoulder, not elsewhere classified: Secondary | ICD-10-CM | POA: Diagnosis not present

## 2020-11-19 DIAGNOSIS — R293 Abnormal posture: Secondary | ICD-10-CM | POA: Diagnosis not present

## 2020-11-19 DIAGNOSIS — M25511 Pain in right shoulder: Secondary | ICD-10-CM | POA: Diagnosis not present

## 2020-11-19 DIAGNOSIS — M6281 Muscle weakness (generalized): Secondary | ICD-10-CM | POA: Diagnosis not present

## 2020-11-20 ENCOUNTER — Other Ambulatory Visit (HOSPITAL_BASED_OUTPATIENT_CLINIC_OR_DEPARTMENT_OTHER): Payer: Medicare Other

## 2020-11-24 DIAGNOSIS — M25611 Stiffness of right shoulder, not elsewhere classified: Secondary | ICD-10-CM | POA: Diagnosis not present

## 2020-11-24 DIAGNOSIS — M6281 Muscle weakness (generalized): Secondary | ICD-10-CM | POA: Diagnosis not present

## 2020-11-24 DIAGNOSIS — R293 Abnormal posture: Secondary | ICD-10-CM | POA: Diagnosis not present

## 2020-11-24 DIAGNOSIS — M25511 Pain in right shoulder: Secondary | ICD-10-CM | POA: Diagnosis not present

## 2020-11-24 DIAGNOSIS — M25411 Effusion, right shoulder: Secondary | ICD-10-CM | POA: Diagnosis not present

## 2020-11-25 DIAGNOSIS — I129 Hypertensive chronic kidney disease with stage 1 through stage 4 chronic kidney disease, or unspecified chronic kidney disease: Secondary | ICD-10-CM | POA: Diagnosis not present

## 2020-11-25 DIAGNOSIS — E039 Hypothyroidism, unspecified: Secondary | ICD-10-CM | POA: Diagnosis not present

## 2020-11-25 DIAGNOSIS — J45909 Unspecified asthma, uncomplicated: Secondary | ICD-10-CM | POA: Diagnosis not present

## 2020-11-25 DIAGNOSIS — N1832 Chronic kidney disease, stage 3b: Secondary | ICD-10-CM | POA: Diagnosis not present

## 2020-11-25 DIAGNOSIS — E7800 Pure hypercholesterolemia, unspecified: Secondary | ICD-10-CM | POA: Diagnosis not present

## 2020-11-26 DIAGNOSIS — M25411 Effusion, right shoulder: Secondary | ICD-10-CM | POA: Diagnosis not present

## 2020-11-26 DIAGNOSIS — M6281 Muscle weakness (generalized): Secondary | ICD-10-CM | POA: Diagnosis not present

## 2020-11-26 DIAGNOSIS — M25611 Stiffness of right shoulder, not elsewhere classified: Secondary | ICD-10-CM | POA: Diagnosis not present

## 2020-11-26 DIAGNOSIS — M25511 Pain in right shoulder: Secondary | ICD-10-CM | POA: Diagnosis not present

## 2020-11-26 DIAGNOSIS — R293 Abnormal posture: Secondary | ICD-10-CM | POA: Diagnosis not present

## 2020-11-27 ENCOUNTER — Ambulatory Visit: Payer: Medicare Other | Attending: Internal Medicine

## 2020-11-27 ENCOUNTER — Other Ambulatory Visit: Payer: Self-pay

## 2020-11-27 ENCOUNTER — Ambulatory Visit (HOSPITAL_BASED_OUTPATIENT_CLINIC_OR_DEPARTMENT_OTHER)
Admission: RE | Admit: 2020-11-27 | Discharge: 2020-11-27 | Disposition: A | Discharge status: Home / Self Care | Payer: Medicare Other | Source: Ambulatory Visit | Attending: Family Medicine | Admitting: Family Medicine

## 2020-11-27 DIAGNOSIS — Z780 Asymptomatic menopausal state: Secondary | ICD-10-CM | POA: Diagnosis not present

## 2020-11-27 DIAGNOSIS — Z23 Encounter for immunization: Secondary | ICD-10-CM

## 2020-11-27 DIAGNOSIS — Z87310 Personal history of (healed) osteoporosis fracture: Secondary | ICD-10-CM | POA: Insufficient documentation

## 2020-11-27 NOTE — Progress Notes (Signed)
   Covid-19 Vaccination Clinic  Name:  Krystal Ward    MRN: [id number] DOB: 1945-11-11  11/27/2020  [name] was observed post Covid-19 immunization for 15 minutes without incident. She was provided with Vaccine Information Sheet and instruction to access the V-Safe system.   [name] was instructed to call 911 with any severe reactions post vaccine: Difficulty breathing  Swelling of face and throat  A fast heartbeat  A bad rash all over body  Dizziness and weakness   Immunizations Administered     Name Date Dose VIS Date Route   Pfizer Covid-19 Vaccine Bivalent Booster 11/27/2020  2:41 PM 0.3 mL 10/01/2020 Intramuscular   Manufacturer: [place]   Lot: [id number]   [place]: [phone]

## 2020-12-02 DIAGNOSIS — S42291D Other displaced fracture of upper end of right humerus, subsequent encounter for fracture with routine healing: Secondary | ICD-10-CM | POA: Diagnosis not present

## 2020-12-03 DIAGNOSIS — M6281 Muscle weakness (generalized): Secondary | ICD-10-CM | POA: Diagnosis not present

## 2020-12-03 DIAGNOSIS — M25511 Pain in right shoulder: Secondary | ICD-10-CM | POA: Diagnosis not present

## 2020-12-03 DIAGNOSIS — M25611 Stiffness of right shoulder, not elsewhere classified: Secondary | ICD-10-CM | POA: Diagnosis not present

## 2020-12-03 DIAGNOSIS — M25411 Effusion, right shoulder: Secondary | ICD-10-CM | POA: Diagnosis not present

## 2020-12-03 DIAGNOSIS — R293 Abnormal posture: Secondary | ICD-10-CM | POA: Diagnosis not present

## 2020-12-05 DIAGNOSIS — M25511 Pain in right shoulder: Secondary | ICD-10-CM | POA: Diagnosis not present

## 2020-12-05 DIAGNOSIS — R293 Abnormal posture: Secondary | ICD-10-CM | POA: Diagnosis not present

## 2020-12-05 DIAGNOSIS — M25411 Effusion, right shoulder: Secondary | ICD-10-CM | POA: Diagnosis not present

## 2020-12-05 DIAGNOSIS — M25611 Stiffness of right shoulder, not elsewhere classified: Secondary | ICD-10-CM | POA: Diagnosis not present

## 2020-12-05 DIAGNOSIS — M6281 Muscle weakness (generalized): Secondary | ICD-10-CM | POA: Diagnosis not present

## 2020-12-12 DIAGNOSIS — M6281 Muscle weakness (generalized): Secondary | ICD-10-CM | POA: Diagnosis not present

## 2020-12-12 DIAGNOSIS — M25511 Pain in right shoulder: Secondary | ICD-10-CM | POA: Diagnosis not present

## 2020-12-12 DIAGNOSIS — M25411 Effusion, right shoulder: Secondary | ICD-10-CM | POA: Diagnosis not present

## 2020-12-12 DIAGNOSIS — M25611 Stiffness of right shoulder, not elsewhere classified: Secondary | ICD-10-CM | POA: Diagnosis not present

## 2020-12-12 DIAGNOSIS — R293 Abnormal posture: Secondary | ICD-10-CM | POA: Diagnosis not present

## 2020-12-15 DIAGNOSIS — M25511 Pain in right shoulder: Secondary | ICD-10-CM | POA: Diagnosis not present

## 2020-12-15 DIAGNOSIS — M25611 Stiffness of right shoulder, not elsewhere classified: Secondary | ICD-10-CM | POA: Diagnosis not present

## 2020-12-15 DIAGNOSIS — M6281 Muscle weakness (generalized): Secondary | ICD-10-CM | POA: Diagnosis not present

## 2020-12-15 DIAGNOSIS — R293 Abnormal posture: Secondary | ICD-10-CM | POA: Diagnosis not present

## 2020-12-15 DIAGNOSIS — M25411 Effusion, right shoulder: Secondary | ICD-10-CM | POA: Diagnosis not present

## 2020-12-18 DIAGNOSIS — M25611 Stiffness of right shoulder, not elsewhere classified: Secondary | ICD-10-CM | POA: Diagnosis not present

## 2020-12-18 DIAGNOSIS — R293 Abnormal posture: Secondary | ICD-10-CM | POA: Diagnosis not present

## 2020-12-18 DIAGNOSIS — M6281 Muscle weakness (generalized): Secondary | ICD-10-CM | POA: Diagnosis not present

## 2020-12-18 DIAGNOSIS — M25511 Pain in right shoulder: Secondary | ICD-10-CM | POA: Diagnosis not present

## 2020-12-18 DIAGNOSIS — M25411 Effusion, right shoulder: Secondary | ICD-10-CM | POA: Diagnosis not present

## 2020-12-22 ENCOUNTER — Other Ambulatory Visit (HOSPITAL_BASED_OUTPATIENT_CLINIC_OR_DEPARTMENT_OTHER): Payer: Self-pay

## 2020-12-22 DIAGNOSIS — Z23 Encounter for immunization: Secondary | ICD-10-CM | POA: Diagnosis not present

## 2020-12-22 MED ORDER — PFIZER COVID-19 VAC BIVALENT 30 MCG/0.3ML IM SUSP
INTRAMUSCULAR | 0 refills | Status: DC
  Filled 2020-12-22: qty 0.3, 1d supply, fill #0

## 2021-01-09 DIAGNOSIS — E039 Hypothyroidism, unspecified: Secondary | ICD-10-CM | POA: Diagnosis not present

## 2021-01-09 DIAGNOSIS — Z1211 Encounter for screening for malignant neoplasm of colon: Secondary | ICD-10-CM | POA: Diagnosis not present

## 2021-01-09 DIAGNOSIS — Z79899 Other long term (current) drug therapy: Secondary | ICD-10-CM | POA: Diagnosis not present

## 2021-01-09 DIAGNOSIS — E7800 Pure hypercholesterolemia, unspecified: Secondary | ICD-10-CM | POA: Diagnosis not present

## 2021-01-13 DIAGNOSIS — S42291D Other displaced fracture of upper end of right humerus, subsequent encounter for fracture with routine healing: Secondary | ICD-10-CM | POA: Diagnosis not present

## 2021-01-15 DIAGNOSIS — Z1211 Encounter for screening for malignant neoplasm of colon: Secondary | ICD-10-CM | POA: Diagnosis not present

## 2021-03-16 ENCOUNTER — Ambulatory Visit
Admission: EM | Admit: 2021-03-16 | Discharge: 2021-03-16 | Disposition: A | Discharge status: Home / Self Care | Payer: Medicare Other | Attending: Student | Admitting: Student

## 2021-03-16 ENCOUNTER — Other Ambulatory Visit: Payer: Self-pay

## 2021-03-16 DIAGNOSIS — Z20822 Contact with and (suspected) exposure to covid-19: Secondary | ICD-10-CM

## 2021-03-16 DIAGNOSIS — U071 COVID-19: Secondary | ICD-10-CM | POA: Diagnosis not present

## 2021-03-16 MED ORDER — MOLNUPIRAVIR EUA 200MG CAPSULE: 4.0000 | ORAL_CAPSULE | Freq: Two times a day (BID) | ORAL | 0 refills | Status: AC

## 2021-03-16 MED ORDER — ALBUTEROL SULFATE HFA 108 (90 BASE) MCG/ACT IN AERS: 1.0000 | INHALATION_SPRAY | Freq: Four times a day (QID) | RESPIRATORY_TRACT | 0 refills | Status: DC | PRN

## 2021-03-16 NOTE — ED Provider Notes (Signed)
[facility]    CSN: [id number] Arrival date & time: 03/16/21  1007      History   Chief Complaint Chief Complaint  Patient presents with   Cough   Nasal Congestion    HPI Krystal Ward is a 76 y.o. female.   HPI  Past Medical History:  Diagnosis Date   Asthma    Family history of anesthesia complication    Mother has N/V   GERD (gastroesophageal reflux disease)    tums prn   Hypertension    Hypothyroidism     Patient Active Problem List   Diagnosis Date Noted   Nasal polyps 11/22/2013    Class: Chronic    Past Surgical History:  Procedure Laterality Date   BREAST SURGERY     right breast biopsy   EYE SURGERY Bilateral    bil Restore Lens   NASAL POLYP SURGERY  2010   POLYPECTOMY Bilateral 11/22/2013   Procedure: POLYPECTOMY NASAL;  Surgeon: Jerrell Belfast, MD;  Location: [place];  Service: ENT;  Laterality: Bilateral;   SINUS ENDO W/FUSION Bilateral 11/22/2013   Procedure: REVISION BILATERAL ENDOSCOPIC SINUS SURGERY;  Surgeon: Jerrell Belfast, MD;  Location: [place];  Service: ENT;  Laterality: Bilateral;   THYROID SURGERY  1970's    Removed 3/4    OB History   No obstetric history on file.      Home Medications    Prior to Admission medications   Medication Sig Start Date End Date Taking? Authorizing Provider  albuterol (VENTOLIN HFA) 108 (90 Base) MCG/ACT inhaler Inhale 1-2 puffs into the lungs every 6 (six) hours as needed for wheezing or shortness of breath. 03/16/21  Yes Hazel Sams, PA-C  molnupiravir EUA (LAGEVRIO) 200 mg CAPS capsule Take 4 capsules (800 mg total) by mouth 2 (two) times daily for 5 days. 03/16/21 03/21/21 Yes Hazel Sams, PA-C  Ascorbic Acid (VITAMIN C) 1000 MG tablet Take 1,000 mg by mouth daily.    [provider]  budesonide-formoterol (SYMBICORT) 160-4.5 MCG/ACT inhaler Inhale 2 puffs into the lungs 2 (two) times daily.    [provider]  Calcium Carb-Cholecalciferol (CALCIUM 600 + D  PO) Take 1 tablet by mouth 2 (two) times daily.    [provider]  Cholecalciferol (VITAMIN D3) 1000 UNITS CAPS Take 1 capsule by mouth daily.    [provider]  Chromium Picolinate 1000 MCG TABS Take 1,000 mcg by mouth daily.    [provider]  COVID-19 mRNA bivalent vaccine, Pfizer, (PFIZER COVID-19 VAC BIVALENT) injection Inject into the muscle. 11/27/20   Carlyle Basques, MD  FLOVENT [facility] 110 MCG/ACT inhaler  12/25/18   [provider]  fluticasone (FLONASE) 50 MCG/ACT nasal spray Place 2 sprays into both nostrils daily.    [provider]  HYDROcodone-acetaminophen (NORCO/VICODIN) 5-325 MG tablet Take 1 tablet by mouth every 8 (eight) hours as needed. 10/24/20   Quintella Reichert, MD  levothyroxine (SYNTHROID) 125 MCG tablet SMARTSIG:3 Tablet(s) By Mouth Every Other Day PRN 02/08/19   [provider]  losartan (COZAAR) 100 MG tablet Take 100 mg by mouth daily.    [provider]  lovastatin (MEVACOR) 40 MG tablet Take 40 mg by mouth at bedtime.    [provider]  metoprolol succinate (TOPROL-XL) 50 MG 24 hr tablet Take 50 mg by mouth daily. Take with or immediately following a meal.    [provider]  triamterene-hydrochlorothiazide (DYAZIDE) 37.5-25 MG per capsule Take 1 capsule by  mouth daily.    [provider]    Family History History reviewed. No pertinent family history.  Social History Social History   Tobacco Use   Smoking status: Never   Smokeless tobacco: Never  Vaping Use   Vaping Use: Never used  Substance Use Topics   Alcohol use: Yes    Alcohol/week: 2.0 standard drinks    Types: 2 drink(s) per week    Comment: occ   Drug use: No     Allergies   Lovastatin   Review of Systems Review of Systems   Physical Exam Triage Vital Signs ED Triage Vitals  Enc Vitals Group     BP 03/16/21 1039 (!) 143/72     Pulse Rate 03/16/21 1039 85     Resp 03/16/21 1039 20     Temp  03/16/21 1039 99.9 F (37.7 C)     Temp Source 03/16/21 1039 Oral     SpO2 03/16/21 1039 95 %     Weight --      Height --      Head Circumference --      Peak Flow --      Pain Score 03/16/21 1037 0     Pain Loc --      Pain Edu? --      Excl. in [place]? --    No data found.  Updated Vital Signs BP (!) 143/72 (BP Location: Right Arm)    Pulse 85    Temp 99.9 F (37.7 C) (Oral)    Resp 20    SpO2 95%   Visual Acuity Right Eye Distance:   Left Eye Distance:   Bilateral Distance:    Right Eye Near:   Left Eye Near:    Bilateral Near:     Physical Exam   UC Treatments / Results  Labs (all labs ordered are listed, but only abnormal results are displayed) Labs Reviewed  NOVEL CORONAVIRUS, NAA    EKG   Radiology No results found.  Procedures Procedures (including critical care time)  Medications Ordered in UC Medications - No data to display  Initial Impression / Assessment and Plan / UC Course  I have reviewed the triage vital signs and the nursing notes.  Pertinent labs & imaging results that were available during my care of the patient were reviewed by me and considered in my medical decision making (see chart for details).     This patient is a very pleasant 76 y.o. year old female presenting with covid-19 following exposure to this.  Afebrile, nontachycardic.  Vaccinated for covid. COVID PCR sent at patient request.  History of asthma, this is currently moderately well controlled on daily steroid inhalers, albuterol sent as needed.  She is interested in antiviral management, I do not have a recent BMP for her, and there are some medication interactions with Paxlovid, so will treat with molnupiravir.  Patient is in agreement.  ED return precautions discussed. Patient verbalizes understanding and agreement.   Coding Level 4 for acute exacerbation of chronic condition, and prescription drug management  Final Clinical Impressions(s) / UC Diagnoses   Final  diagnoses:  COVID-19  Exposure to COVID-19 virus     Discharge Instructions      -Molnupiravir twice daily x5 days -Albuterol inhaler as needed for cough, wheezing, shortness of breath, 1 to 2 puffs every 6 hours as needed. -Continue daily inhalers.  -Follow-up if symptoms worsen - shortness of breath, chest pain, dizziness, etc.    ED  Prescriptions     Medication Sig Dispense Auth. Provider   molnupiravir EUA (LAGEVRIO) 200 mg CAPS capsule Take 4 capsules (800 mg total) by mouth 2 (two) times daily for 5 days. 40 capsule Hazel Sams, PA-C   albuterol (VENTOLIN HFA) 108 (90 Base) MCG/ACT inhaler Inhale 1-2 puffs into the lungs every 6 (six) hours as needed for wheezing or shortness of breath. 1 each Hazel Sams, PA-C      PDMP not reviewed this encounter.   Hazel Sams, PA-C 03/16/21 1111

## 2021-03-16 NOTE — Discharge Instructions (Addendum)
-  Molnupiravir twice daily x5 days -Albuterol inhaler as needed for cough, wheezing, shortness of breath, 1 to 2 puffs every 6 hours as needed. -Continue daily inhalers.  -Follow-up if symptoms worsen - shortness of breath, chest pain, dizziness, etc.

## 2021-03-16 NOTE — ED Triage Notes (Signed)
Pt reports having a cough, irritation to throat (from cough), congestion and fatigue that began Saturday. Patient states her Husband was recently hospitalized with covid.

## 2021-03-17 LAB — NOVEL CORONAVIRUS, NAA: SARS-CoV-2, NAA: DETECTED — AB

## 2021-05-01 DIAGNOSIS — J45909 Unspecified asthma, uncomplicated: Secondary | ICD-10-CM | POA: Diagnosis not present

## 2021-05-01 DIAGNOSIS — E7800 Pure hypercholesterolemia, unspecified: Secondary | ICD-10-CM | POA: Diagnosis not present

## 2021-05-01 DIAGNOSIS — I129 Hypertensive chronic kidney disease with stage 1 through stage 4 chronic kidney disease, or unspecified chronic kidney disease: Secondary | ICD-10-CM | POA: Diagnosis not present

## 2021-05-06 DIAGNOSIS — E039 Hypothyroidism, unspecified: Secondary | ICD-10-CM | POA: Diagnosis not present

## 2021-05-06 DIAGNOSIS — N183 Chronic kidney disease, stage 3 unspecified: Secondary | ICD-10-CM | POA: Diagnosis not present

## 2021-05-06 DIAGNOSIS — J45909 Unspecified asthma, uncomplicated: Secondary | ICD-10-CM | POA: Diagnosis not present

## 2021-05-06 DIAGNOSIS — E7800 Pure hypercholesterolemia, unspecified: Secondary | ICD-10-CM | POA: Diagnosis not present

## 2021-05-06 DIAGNOSIS — I700 Atherosclerosis of aorta: Secondary | ICD-10-CM | POA: Diagnosis not present

## 2021-05-06 DIAGNOSIS — I129 Hypertensive chronic kidney disease with stage 1 through stage 4 chronic kidney disease, or unspecified chronic kidney disease: Secondary | ICD-10-CM | POA: Diagnosis not present

## 2021-05-06 DIAGNOSIS — J309 Allergic rhinitis, unspecified: Secondary | ICD-10-CM | POA: Diagnosis not present

## 2021-05-20 ENCOUNTER — Ambulatory Visit
Admission: EM | Admit: 2021-05-20 | Discharge: 2021-05-20 | Disposition: A | Discharge status: Home / Self Care | Payer: Medicare Other | Attending: Emergency Medicine | Admitting: Emergency Medicine

## 2021-05-20 DIAGNOSIS — J302 Other seasonal allergic rhinitis: Secondary | ICD-10-CM

## 2021-05-20 DIAGNOSIS — J4521 Mild intermittent asthma with (acute) exacerbation: Secondary | ICD-10-CM

## 2021-05-20 MED ORDER — TRELEGY ELLIPTA 200-62.5-25 MCG/ACT IN AEPB: 1.0000 | INHALATION_SPRAY | Freq: Every morning | RESPIRATORY_TRACT | 0 refills | Status: AC

## 2021-05-20 MED ORDER — PROMETHAZINE-DM 6.25-15 MG/5ML PO SYRP: 5.0000 mL | ORAL_SOLUTION | Freq: Four times a day (QID) | ORAL | 0 refills | Status: DC | PRN

## 2021-05-20 MED ORDER — DEXAMETHASONE SODIUM PHOSPHATE 10 MG/ML IJ SOLN
10.0000 mg | Freq: Once | INTRAMUSCULAR | Status: AC
  Administered 2021-05-20: 10 mg via INTRAMUSCULAR

## 2021-05-20 NOTE — ED Triage Notes (Signed)
Pt c/o cough, and SOB for a while. Patient does not display signs/ symptoms of respiratory distress.   ?

## 2021-05-20 NOTE — Discharge Instructions (Addendum)
You received an injection of steroid during your visit today to help open up your lungs and reduce inflammation.  I do not believe that you need any more oral steroids in addition to this injection. ? ?I do believe you will benefit from taking ibuprofen 400 mg 2-3 times daily to continue to keep your airway inflammation under control. ? ?I provided you with a sample of Trelegy which is a high-dose long-acting inhaled corticosteroid with a long-acting albuterol and a third ingredient called umeclidinium which is an anticholinergic.  It is similar to Symbicort but the third ingredient makes it a little superior to Symbicort.  Please inhale 1 puff daily.  Once the sample has run out, please return to using your regularly prescribed inhalers. ? ?Please continue using your steroid nasal spray, fluticasone, and your albuterol inhaler 2 puffs 4 times daily and again at any other time when you are coughing or feeling short of breath. ? ?Please also continue Zyrtec daily. ? ?I provided you with a cough medication to use at bedtime called Promethazine DM.  Please take 5 mL about an hour before you plan to go to bed. ? ?Thank you for visiting urgent care today.  Please follow-up with your primary care provider or return to urgent care in the next 5 to 7 days if you have not had significant improvement of your symptoms. ?

## 2021-05-21 NOTE — ED Provider Notes (Signed)
?[facility] ? ? ? ?CSN: [id number] ?Arrival date & time: 05/20/21  1009 ?  ? ?HISTORY  ? ?Chief Complaint  ?Patient presents with  ? Cough  ? Shortness of Breath  ? ?HPI ?Krystal Ward is a 76 y.o. female. Patient complains of cough and shortness of breath for the past week or so.  Patient reports a history of asthma in the past, has previously been prescribed Symbicort but not currently using.  Patient was diagnosed with COVID 2 months ago, states she does not feel that she is completely returned to normal since then but did get better before this episode began.  Patient denies fever, aches, chills, nausea, vomiting, diarrhea.  Patient also reports a history of allergies, currently takes Zyrtec every day. ? ?The history is provided by the patient.  ?Past Medical History:  ?Diagnosis Date  ? Asthma   ? Family history of anesthesia complication   ? Mother has N/V  ? GERD (gastroesophageal reflux disease)   ? tums prn  ? Hypertension   ? Hypothyroidism   ? ?Patient Active Problem List  ? Diagnosis Date Noted  ? Nasal polyps 11/22/2013  ?  Class: Chronic  ? ?Past Surgical History:  ?Procedure Laterality Date  ? BREAST SURGERY    ? right breast biopsy  ? EYE SURGERY Bilateral   ? bil Restore Lens  ? NASAL POLYP SURGERY  2010  ? POLYPECTOMY Bilateral 11/22/2013  ? Procedure: POLYPECTOMY NASAL;  Surgeon: Jerrell Belfast, MD;  Location: [place];  Service: ENT;  Laterality: Bilateral;  ? SINUS ENDO W/FUSION Bilateral 11/22/2013  ? Procedure: REVISION BILATERAL ENDOSCOPIC SINUS SURGERY;  Surgeon: Jerrell Belfast, MD;  Location: [place];  Service: ENT;  Laterality: Bilateral;  ? THYROID SURGERY  1970's   ? Removed 3/4  ? ?OB History   ?No obstetric history on file. ?  ? ?Home Medications   ? ?Prior to Admission medications   ?Medication Sig Start Date End Date Taking? Authorizing Provider  ?Fluticasone-Umeclidin-Vilant (TRELEGY ELLIPTA) 200-62.5-25 MCG/ACT AEPB Inhale 1 puff into the lungs in the morning for 14  days. 05/20/21 06/03/21 Yes Lynden Oxford Scales, PA-C  ?promethazine-dextromethorphan (PROMETHAZINE-DM) 6.25-15 MG/5ML syrup Take 5 mLs by mouth 4 (four) times daily as needed for cough. 05/20/21  Yes Lynden Oxford Scales, PA-C  ? ?Family History ?History reviewed. No pertinent family history. ?Social History ?Social History  ? ?Tobacco Use  ? Smoking status: Never  ? Smokeless tobacco: Never  ?Vaping Use  ? Vaping Use: Never used  ?Substance Use Topics  ? Alcohol use: Yes  ?  Alcohol/week: 2.0 standard drinks  ?  Types: 2 drink(s) per week  ?  Comment: occ  ? Drug use: No  ? ?Allergies   ?Lovastatin ? ?Review of Systems ?Review of Systems ?Pertinent findings noted in history of present illness.  ? ?Physical Exam ?Triage Vital Signs ?ED Triage Vitals  ?Enc Vitals Group  ?   BP 11/28/20 0827 (!) 147/82  ?   Pulse Rate 11/28/20 0827 72  ?   Resp 11/28/20 0827 18  ?   Temp 11/28/20 0827 98.3 ?F (36.8 ?C)  ?   Temp Source 11/28/20 0827 Oral  ?   SpO2 11/28/20 0827 98 %  ?   Weight --   ?   Height --   ?   Head Circumference --   ?   Peak Flow --   ?   Pain Score 11/28/20 0826 5  ?   Pain  Loc --   ?   Pain Edu? --   ?   Excl. in [place]? --   ?No data found. ? ?Updated Vital Signs ?BP (!) 154/73 (BP Location: Left Arm)   Pulse 79   Temp 98.2 ?F (36.8 ?C) (Oral)   Resp 18   SpO2 95%  ? ?Physical Exam ?Vitals and nursing note reviewed.  ?Constitutional:   ?   General: She is not in acute distress. ?   Appearance: Normal appearance. She is not ill-appearing.  ?HENT:  ?   Head: Normocephalic and atraumatic.  ?   Salivary Glands: Right salivary gland is not diffusely enlarged or tender. Left salivary gland is not diffusely enlarged or tender.  ?   Right Ear: Tympanic membrane, ear canal and external ear normal. No drainage. No middle ear effusion. There is no impacted cerumen. Tympanic membrane is not erythematous or bulging.  ?   Left Ear: Tympanic membrane, ear canal and external ear normal. No drainage.  No middle ear  effusion. There is no impacted cerumen. Tympanic membrane is not erythematous or bulging.  ?   Nose: Nose normal. No nasal deformity, septal deviation, mucosal edema, congestion or rhinorrhea.  ?   Right Turbinates: Not enlarged, swollen or pale.  ?   Left Turbinates: Not enlarged, swollen or pale.  ?   Right Sinus: No maxillary sinus tenderness or frontal sinus tenderness.  ?   Left Sinus: No maxillary sinus tenderness or frontal sinus tenderness.  ?   Mouth/Throat:  ?   Lips: Pink. No lesions.  ?   Mouth: Mucous membranes are moist. No oral lesions.  ?   Pharynx: Oropharynx is clear. Uvula midline. No posterior oropharyngeal erythema or uvula swelling.  ?   Tonsils: No tonsillar exudate. 0 on the right. 0 on the left.  ?Eyes:  ?   General: Lids are normal.     ?   Right eye: No discharge.     ?   Left eye: No discharge.  ?   Extraocular Movements: Extraocular movements intact.  ?   Conjunctiva/sclera: Conjunctivae normal.  ?   Right eye: Right conjunctiva is not injected.  ?   Left eye: Left conjunctiva is not injected.  ?Neck:  ?   Trachea: Trachea and phonation normal.  ?Cardiovascular:  ?   Rate and Rhythm: Normal rate and regular rhythm.  ?   Pulses: Normal pulses.  ?   Heart sounds: Normal heart sounds. No murmur heard. ?  No friction rub. No gallop.  ?Pulmonary:  ?   Effort: Pulmonary effort is normal. Prolonged expiration present. No tachypnea, bradypnea, accessory muscle usage, respiratory distress or retractions.  ?   Breath sounds: No stridor, decreased air movement or transmitted upper airway sounds. Examination of the right-upper field reveals wheezing. Examination of the left-upper field reveals wheezing. Examination of the right-middle field reveals wheezing. Examination of the left-middle field reveals wheezing. Wheezing present. No decreased breath sounds, rhonchi or rales.  ?Chest:  ?   Chest wall: No tenderness.  ?Musculoskeletal:     ?   General: Normal range of motion.  ?   Cervical back:  Normal range of motion and neck supple. Normal range of motion.  ?Lymphadenopathy:  ?   Cervical: No cervical adenopathy.  ?Skin: ?   General: Skin is warm and dry.  ?   Findings: No erythema or rash.  ?Neurological:  ?   General: No focal deficit present.  ?   Mental Status:  She is alert and oriented to person, place, and time.  ?Psychiatric:     ?   Mood and Affect: Mood normal.     ?   Behavior: Behavior normal.  ? ? ?Visual Acuity ?Right Eye Distance:   ?Left Eye Distance:   ?Bilateral Distance:   ? ?Right Eye Near:   ?Left Eye Near:    ?Bilateral Near:    ? ?UC Couse / Diagnostics / Procedures:  ?  ?EKG ? ?Radiology ?No results found. ? ?Procedures ?Procedures (including critical care time) ? ?UC Diagnoses / Final Clinical Impressions(s)   ?I have reviewed the triage vital signs and the nursing notes. ? ?Pertinent labs & imaging results that were available during my care of the patient were reviewed by me and considered in my medical decision making (see chart for details).   ?Final diagnoses:  ?Mild intermittent asthma with (acute) exacerbation  ?Seasonal allergies  ? ?Patient advised that she has significant wheezing on exam today.  Patient also advised that I recommend that she resume maintenance inhaler, patient provided with a sample of Trelegy.  Patient states she plans to reach out to her primary care provider for prescription renewal of her maintenance inhaler.  Patient advised to continue using albuterol generously.  Patient provided with cough medicine for nighttime cough. ? ?ED Prescriptions   ? ? Medication Sig Dispense Auth. Provider  ? Fluticasone-Umeclidin-Vilant (TRELEGY ELLIPTA) 200-62.5-25 MCG/ACT AEPB Inhale 1 puff into the lungs in the morning for 14 days. 1 each Lynden Oxford Scales, PA-C  ? promethazine-dextromethorphan (PROMETHAZINE-DM) 6.25-15 MG/5ML syrup Take 5 mLs by mouth 4 (four) times daily as needed for cough. 118 mL Lynden Oxford Scales, PA-C  ? ?  ? ?PDMP not reviewed this  encounter. ? ?Pending results:  ?Labs Reviewed - No data to display ? ?Medications Ordered in UC: ?Medications  ?dexamethasone (DECADRON) injection 10 mg (10 mg Intramuscular Given 05/20/21 1106)  ? ? ?Disposition Up

## 2021-05-25 DIAGNOSIS — L720 Epidermal cyst: Secondary | ICD-10-CM | POA: Diagnosis not present

## 2021-06-11 ENCOUNTER — Encounter (INDEPENDENT_AMBULATORY_CARE_PROVIDER_SITE_OTHER): Payer: Medicare Other | Admitting: Ophthalmology

## 2021-06-11 DIAGNOSIS — H35033 Hypertensive retinopathy, bilateral: Secondary | ICD-10-CM

## 2021-06-11 DIAGNOSIS — H353111 Nonexudative age-related macular degeneration, right eye, early dry stage: Secondary | ICD-10-CM

## 2021-06-11 DIAGNOSIS — H353122 Nonexudative age-related macular degeneration, left eye, intermediate dry stage: Secondary | ICD-10-CM | POA: Diagnosis not present

## 2021-06-11 DIAGNOSIS — I10 Essential (primary) hypertension: Secondary | ICD-10-CM

## 2021-06-11 DIAGNOSIS — H35341 Macular cyst, hole, or pseudohole, right eye: Secondary | ICD-10-CM

## 2021-06-11 DIAGNOSIS — H43813 Vitreous degeneration, bilateral: Secondary | ICD-10-CM

## 2021-06-17 DIAGNOSIS — L720 Epidermal cyst: Secondary | ICD-10-CM | POA: Diagnosis not present

## 2021-07-13 DIAGNOSIS — L720 Epidermal cyst: Secondary | ICD-10-CM | POA: Diagnosis not present

## 2021-07-17 DIAGNOSIS — Z1231 Encounter for screening mammogram for malignant neoplasm of breast: Secondary | ICD-10-CM | POA: Diagnosis not present

## 2021-07-27 DIAGNOSIS — Z85828 Personal history of other malignant neoplasm of skin: Secondary | ICD-10-CM | POA: Diagnosis not present

## 2021-07-27 DIAGNOSIS — Z129 Encounter for screening for malignant neoplasm, site unspecified: Secondary | ICD-10-CM | POA: Diagnosis not present

## 2021-07-27 DIAGNOSIS — D485 Neoplasm of uncertain behavior of skin: Secondary | ICD-10-CM | POA: Diagnosis not present

## 2021-07-27 DIAGNOSIS — C44619 Basal cell carcinoma of skin of left upper limb, including shoulder: Secondary | ICD-10-CM | POA: Diagnosis not present

## 2021-07-27 DIAGNOSIS — D235 Other benign neoplasm of skin of trunk: Secondary | ICD-10-CM | POA: Diagnosis not present

## 2021-07-27 DIAGNOSIS — L720 Epidermal cyst: Secondary | ICD-10-CM | POA: Diagnosis not present

## 2021-07-30 DIAGNOSIS — J45909 Unspecified asthma, uncomplicated: Secondary | ICD-10-CM | POA: Diagnosis not present

## 2021-07-30 DIAGNOSIS — N1832 Chronic kidney disease, stage 3b: Secondary | ICD-10-CM | POA: Diagnosis not present

## 2021-07-30 DIAGNOSIS — I129 Hypertensive chronic kidney disease with stage 1 through stage 4 chronic kidney disease, or unspecified chronic kidney disease: Secondary | ICD-10-CM | POA: Diagnosis not present

## 2021-07-30 DIAGNOSIS — E7800 Pure hypercholesterolemia, unspecified: Secondary | ICD-10-CM | POA: Diagnosis not present

## 2021-07-30 DIAGNOSIS — E039 Hypothyroidism, unspecified: Secondary | ICD-10-CM | POA: Diagnosis not present

## 2021-08-31 DIAGNOSIS — L820 Inflamed seborrheic keratosis: Secondary | ICD-10-CM | POA: Diagnosis not present

## 2021-08-31 DIAGNOSIS — L570 Actinic keratosis: Secondary | ICD-10-CM | POA: Diagnosis not present

## 2021-08-31 DIAGNOSIS — C44519 Basal cell carcinoma of skin of other part of trunk: Secondary | ICD-10-CM | POA: Diagnosis not present

## 2021-09-10 DIAGNOSIS — C44519 Basal cell carcinoma of skin of other part of trunk: Secondary | ICD-10-CM | POA: Diagnosis not present

## 2021-10-21 DIAGNOSIS — Z23 Encounter for immunization: Secondary | ICD-10-CM | POA: Diagnosis not present

## 2021-11-04 DIAGNOSIS — E7800 Pure hypercholesterolemia, unspecified: Secondary | ICD-10-CM | POA: Diagnosis not present

## 2021-11-04 DIAGNOSIS — N1831 Chronic kidney disease, stage 3a: Secondary | ICD-10-CM | POA: Diagnosis not present

## 2021-11-04 DIAGNOSIS — G720 Drug-induced myopathy: Secondary | ICD-10-CM | POA: Diagnosis not present

## 2021-11-04 DIAGNOSIS — I700 Atherosclerosis of aorta: Secondary | ICD-10-CM | POA: Diagnosis not present

## 2021-11-04 DIAGNOSIS — J45909 Unspecified asthma, uncomplicated: Secondary | ICD-10-CM | POA: Diagnosis not present

## 2021-11-04 DIAGNOSIS — I129 Hypertensive chronic kidney disease with stage 1 through stage 4 chronic kidney disease, or unspecified chronic kidney disease: Secondary | ICD-10-CM | POA: Diagnosis not present

## 2021-11-04 DIAGNOSIS — E039 Hypothyroidism, unspecified: Secondary | ICD-10-CM | POA: Diagnosis not present

## 2021-11-27 DIAGNOSIS — Z23 Encounter for immunization: Secondary | ICD-10-CM | POA: Diagnosis not present

## 2021-12-28 ENCOUNTER — Ambulatory Visit
Admission: EM | Admit: 2021-12-28 | Discharge: 2021-12-28 | Disposition: A | Discharge status: Home / Self Care | Payer: Medicare Other | Attending: Physician Assistant | Admitting: Physician Assistant

## 2021-12-28 DIAGNOSIS — J45901 Unspecified asthma with (acute) exacerbation: Secondary | ICD-10-CM | POA: Diagnosis not present

## 2021-12-28 DIAGNOSIS — R0602 Shortness of breath: Secondary | ICD-10-CM | POA: Insufficient documentation

## 2021-12-28 DIAGNOSIS — J069 Acute upper respiratory infection, unspecified: Secondary | ICD-10-CM

## 2021-12-28 DIAGNOSIS — J45909 Unspecified asthma, uncomplicated: Secondary | ICD-10-CM | POA: Diagnosis not present

## 2021-12-28 DIAGNOSIS — Z1152 Encounter for screening for COVID-19: Secondary | ICD-10-CM | POA: Diagnosis not present

## 2021-12-28 LAB — RESP PANEL BY RT-PCR (RSV, FLU A&B, COVID)  RVPGX2
Influenza A by PCR: NEGATIVE
Influenza B by PCR: NEGATIVE
Resp Syncytial Virus by PCR: NEGATIVE
SARS Coronavirus 2 by RT PCR: NEGATIVE

## 2021-12-28 MED ORDER — ALBUTEROL SULFATE HFA 108 (90 BASE) MCG/ACT IN AERS
2.0000 | INHALATION_SPRAY | Freq: Once | RESPIRATORY_TRACT | Status: AC
  Administered 2021-12-28: 2 via RESPIRATORY_TRACT

## 2021-12-28 MED ORDER — PREDNISONE 50 MG PO TABS: ORAL_TABLET | ORAL | 0 refills | Status: DC

## 2021-12-28 NOTE — ED Triage Notes (Signed)
Here for a cough that started 2-3 days ago. Pt reports cough is worse at night.

## 2021-12-28 NOTE — Discharge Instructions (Signed)
Use albuterol 2 puffs every 4 hours

## 2021-12-29 NOTE — ED Provider Notes (Signed)
[facility]    CSN: [id number] Arrival date & time: 12/28/21  1426      History   Chief Complaint Chief Complaint  Patient presents with   Cough    HPI Krystal Ward is a 76 y.o. female.   Patient complains of cough and congestion for the past 3 days.  Patient reports that she has been having some shortness of breath.  Patient reports she has a past medical history of asthma.  Patient does have exacerbations of her asthma when she has a viral infection.  Patient reports she has been using her inhaler  The history is provided by the patient. No language interpreter was used.  Cough   Past Medical History:  Diagnosis Date   Asthma    Family history of anesthesia complication    Mother has N/V   GERD (gastroesophageal reflux disease)    tums prn   Hypertension    Hypothyroidism     Patient Active Problem List   Diagnosis Date Noted   Nasal polyps 11/22/2013    Class: Chronic    Past Surgical History:  Procedure Laterality Date   BREAST SURGERY     right breast biopsy   EYE SURGERY Bilateral    bil Restore Lens   NASAL POLYP SURGERY  2010   POLYPECTOMY Bilateral 11/22/2013   Procedure: POLYPECTOMY NASAL;  Surgeon: Jerrell Belfast, MD;  Location: [place];  Service: ENT;  Laterality: Bilateral;   SINUS ENDO W/FUSION Bilateral 11/22/2013   Procedure: REVISION BILATERAL ENDOSCOPIC SINUS SURGERY;  Surgeon: Jerrell Belfast, MD;  Location: [place];  Service: ENT;  Laterality: Bilateral;   THYROID SURGERY  1970's    Removed 3/4    OB History   No obstetric history on file.      Home Medications    Prior to Admission medications   Medication Sig Start Date End Date Taking? Authorizing Provider  predniSONE (DELTASONE) 50 MG tablet One tablet a day 12/28/21  Yes Fransico Meadow, PA-C  promethazine-dextromethorphan (PROMETHAZINE-DM) 6.25-15 MG/5ML syrup Take 5 mLs by mouth 4 (four) times daily as needed for cough. 05/20/21   Lynden Oxford Scales, PA-C     Family History History reviewed. No pertinent family history.  Social History Social History   Tobacco Use   Smoking status: Never   Smokeless tobacco: Never  Vaping Use   Vaping Use: Never used  Substance Use Topics   Alcohol use: Yes    Alcohol/week: 2.0 standard drinks of alcohol    Types: 2 drink(s) per week    Comment: occ   Drug use: No     Allergies   Lovastatin   Review of Systems Review of Systems  Respiratory:  Positive for cough.   All other systems reviewed and are negative.    Physical Exam Triage Vital Signs ED Triage Vitals [12/28/21 1620]  Enc Vitals Group     BP (!) 145/89     Pulse Rate 73     Resp 18     Temp 98.4 F (36.9 C)     Temp Source Oral     SpO2 100 %     Weight      Height      Head Circumference      Peak Flow      Pain Score      Pain Loc      Pain Edu?      Excl. in [place]?    No data found.  Updated  Vital Signs BP (!) 145/89 (BP Location: Left Arm)   Pulse 73   Temp 98.4 F (36.9 C) (Oral)   Resp 18   SpO2 100%   Visual Acuity Right Eye Distance:   Left Eye Distance:   Bilateral Distance:    Right Eye Near:   Left Eye Near:    Bilateral Near:     Physical Exam Vitals and nursing note reviewed.  Constitutional:      Appearance: She is well-developed.  HENT:     Head: Normocephalic.  Pulmonary:     Effort: Pulmonary effort is normal.     Breath sounds: Wheezing present.  Abdominal:     General: There is no distension.  Musculoskeletal:        General: Normal range of motion.     Cervical back: Normal range of motion.  Neurological:     Mental Status: She is alert and oriented to person, place, and time.      UC Treatments / Results  Labs (all labs ordered are listed, but only abnormal results are displayed) Labs Reviewed  RESP PANEL BY RT-PCR (RSV, FLU A&B, COVID)  [id number]    EKG   Radiology No results found.  Procedures Procedures (including critical care time)  Medications  Ordered in UC Medications  albuterol (VENTOLIN HFA) 108 (90 Base) MCG/ACT inhaler 2 puff (2 puffs Inhalation Given 12/28/21 1737)    Initial Impression / Assessment and Plan / UC Course  I have reviewed the triage vital signs and the nursing notes.  Pertinent labs & imaging results that were available during my care of the patient were reviewed by me and considered in my medical decision making (see chart for details).     MDM: Patient is given albuterol 2 puffs here she is reexamined patient has clearing of wheezing.  Patient's oxygen saturation has improved from 91 to 96% patient reports she feels better patient is given a prescription for prednisone she is advised to continue albuterol she is advised to follow-up with her primary care physician COVID and influenza testing are pending Final Clinical Impressions(s) / UC Diagnoses   Final diagnoses:  Moderate asthma, unspecified whether complicated, unspecified whether persistent  Viral URI     Discharge Instructions      Use albuterol 2 puffs every 4 hours    ED Prescriptions     Medication Sig Dispense Auth. Provider   predniSONE (DELTASONE) 50 MG tablet One tablet a day 5 tablet Fransico Meadow, [place]      PDMP not reviewed this encounter. An After Visit Summary was printed and given to the patient.       Fransico Meadow, [place] 12/29/21 2040

## 2021-12-30 DIAGNOSIS — E039 Hypothyroidism, unspecified: Secondary | ICD-10-CM | POA: Diagnosis not present

## 2021-12-30 DIAGNOSIS — I129 Hypertensive chronic kidney disease with stage 1 through stage 4 chronic kidney disease, or unspecified chronic kidney disease: Secondary | ICD-10-CM | POA: Diagnosis not present

## 2021-12-30 DIAGNOSIS — E7800 Pure hypercholesterolemia, unspecified: Secondary | ICD-10-CM | POA: Diagnosis not present

## 2021-12-30 DIAGNOSIS — J45909 Unspecified asthma, uncomplicated: Secondary | ICD-10-CM | POA: Diagnosis not present

## 2022-02-05 ENCOUNTER — Telehealth: Payer: Self-pay | Admitting: *Deleted

## 2022-02-05 ENCOUNTER — Encounter: Payer: Self-pay | Admitting: *Deleted

## 2022-02-05 NOTE — Patient Instructions (Signed)
Visit Information  Thank you for taking time to visit with me today. Please don't hesitate to contact me if I can be of assistance to you.   Following are the goals we discussed today:   Goals Addressed             This Visit's Progress    COMPLETED: care coordination activity       Care Coordination Interventions: Provided education to patient and/or caregiver about advanced directives Reviewed medications with patient and discussed adherence with no needed refills Reviewed scheduled/upcoming provider appointments including sufficient transportation source Screening for signs and symptoms of depression related to chronic disease state  Assessed social determinant of health barriers Educated on care management services with no presented needs.              Please call the care guide team at [phone] if you need to cancel or reschedule your appointment.   If you are experiencing a Mental Health or [place] or need someone to talk to, please call the Suicide and Crisis Lifeline: 988  Patient verbalizes understanding of instructions and care plan provided today and agrees to view in [place]. Active MyChart status and patient understanding of how to access instructions and care plan via MyChart confirmed with patient.     No further follow up required: No follow up needs  Raina Mina, RN Care Management Coordinator [place] Office [phone]

## 2022-02-05 NOTE — Patient Outreach (Signed)
  Care Coordination   Initial Visit Note   02/05/2022 Name: Krystal Ward MRN: [id number] DOB: 22-Mar-1945  Krystal Ward is a 77 y.o. year old female who sees Pcp, No for primary care. I spoke with  Pricilla Riffle by phone today.  What matters to the patients health and wellness today?  No needs    Goals Addressed             This Visit's Progress    COMPLETED: care coordination activity       Care Coordination Interventions: Provided education to patient and/or caregiver about advanced directives Reviewed medications with patient and discussed adherence with no needed refills Reviewed scheduled/upcoming provider appointments including sufficient transportation source Screening for signs and symptoms of depression related to chronic disease state  Assessed social determinant of health barriers Educated on care management services with no presented needs.              SDOH assessments and interventions completed:  Yes  SDOH Interventions Today    Flowsheet Row Most Recent Value  SDOH Interventions   Food Insecurity Interventions Intervention Not Indicated  Housing Interventions Intervention Not Indicated  Transportation Interventions Intervention Not Indicated  Utilities Interventions Intervention Not Indicated        Care Coordination Interventions:  Yes, provided   Follow up plan: No further intervention required.   Encounter Outcome:  Pt. Visit Completed   Raina Mina, RN Care Management Coordinator [place] Office [phone]

## 2022-03-15 DIAGNOSIS — C4441 Basal cell carcinoma of skin of scalp and neck: Secondary | ICD-10-CM | POA: Diagnosis not present

## 2022-03-15 DIAGNOSIS — C44619 Basal cell carcinoma of skin of left upper limb, including shoulder: Secondary | ICD-10-CM | POA: Diagnosis not present

## 2022-03-15 DIAGNOSIS — C44519 Basal cell carcinoma of skin of other part of trunk: Secondary | ICD-10-CM | POA: Diagnosis not present

## 2022-03-15 DIAGNOSIS — D485 Neoplasm of uncertain behavior of skin: Secondary | ICD-10-CM | POA: Diagnosis not present

## 2022-03-15 DIAGNOSIS — Z85828 Personal history of other malignant neoplasm of skin: Secondary | ICD-10-CM | POA: Diagnosis not present

## 2022-03-16 DIAGNOSIS — Z01419 Encounter for gynecological examination (general) (routine) without abnormal findings: Secondary | ICD-10-CM | POA: Diagnosis not present

## 2022-04-15 ENCOUNTER — Encounter (HOSPITAL_BASED_OUTPATIENT_CLINIC_OR_DEPARTMENT_OTHER): Payer: Self-pay | Admitting: Pediatrics

## 2022-04-15 ENCOUNTER — Emergency Department (HOSPITAL_BASED_OUTPATIENT_CLINIC_OR_DEPARTMENT_OTHER)
Admission: EM | Admit: 2022-04-15 | Discharge: 2022-04-15 | Disposition: A | Discharge status: Home / Self Care | Payer: Medicare Other | Attending: Emergency Medicine | Admitting: Emergency Medicine

## 2022-04-15 ENCOUNTER — Other Ambulatory Visit: Payer: Self-pay

## 2022-04-15 DIAGNOSIS — H579 Unspecified disorder of eye and adnexa: Secondary | ICD-10-CM | POA: Diagnosis present

## 2022-04-15 DIAGNOSIS — H1013 Acute atopic conjunctivitis, bilateral: Secondary | ICD-10-CM

## 2022-04-15 DIAGNOSIS — H1033 Unspecified acute conjunctivitis, bilateral: Secondary | ICD-10-CM | POA: Insufficient documentation

## 2022-04-15 MED ORDER — OLOPATADINE HCL 0.2 % OP SOLN: 1.0000 [drp] | Freq: Two times a day (BID) | OPHTHALMIC | 1 refills | Status: AC

## 2022-04-15 NOTE — ED Provider Notes (Signed)
EMERGENCY DEPARTMENT AT [facility] Provider Note   CSN: [id number] Arrival date & time: 04/15/22  1408     History  Chief Complaint  Patient presents with   Conjunctivitis    Krystal Ward is a 77 y.o. female.   Conjunctivitis     Patient with history of allergies presents to the emergency department due to bilateral eye redness.  Symptoms started today at the same time in both eyes, they have clear water and are itchy.  Does not wear contacts or glasses.  Started having nasal congestion runny nose a week ago and started on Zyrtec which has helped with some symptoms.  Denies any pain with eye movement, vision changes, blurry vision, diplopia, facial swelling.  Does not wear contacts or prescriptive lenses.  Home Medications Prior to Admission medications   Medication Sig Start Date End Date Taking? Authorizing Provider  Olopatadine HCl 0.2 % SOLN Apply 1 drop to eye in the morning and at bedtime. 04/15/22  Yes Sherrill Raring, PA-C  predniSONE (DELTASONE) 50 MG tablet One tablet a day 12/28/21   Fransico Meadow, PA-C  promethazine-dextromethorphan (PROMETHAZINE-DM) 6.25-15 MG/5ML syrup Take 5 mLs by mouth 4 (four) times daily as needed for cough. 05/20/21   Lynden Oxford Scales, PA-C      Allergies    Lovastatin    Review of Systems   Review of Systems  Physical Exam Updated Vital Signs BP (!) 169/70   Pulse (!) 56   Temp 98.1 F (36.7 C)   Resp 17   Ht '5\' 6"'$  (1.676 m)   Wt 83.5 kg   SpO2 100%   BMI 29.70 kg/m  Physical Exam Vitals and nursing note reviewed. Exam conducted with a chaperone present.  Constitutional:      General: She is not in acute distress.    Appearance: Normal appearance.  HENT:     Head: Normocephalic and atraumatic.  Eyes:     General: Lids are normal. Vision grossly intact. Gaze aligned appropriately. No scleral icterus.       Right eye: No discharge.        Left eye: No discharge.     Extraocular Movements:  Extraocular movements intact.     Conjunctiva/sclera:     Right eye: Right conjunctiva is injected. No chemosis or exudate.    Left eye: Left conjunctiva is injected. No chemosis or exudate.    Pupils: Pupils are equal, round, and reactive to light.  Skin:    Coloration: Skin is not jaundiced.  Neurological:     Mental Status: She is alert. Mental status is at baseline.     Coordination: Coordination normal.     ED Results / Procedures / Treatments   Labs (all labs ordered are listed, but only abnormal results are displayed) Labs Reviewed - No data to display  EKG None  Radiology No results found.  Procedures Procedures    Medications Ordered in ED Medications - No data to display  ED Course/ Medical Decision Making/ A&P                             Medical Decision Making  Patient presents due to bilateral red eyes and itching.  Physical exam and story seem most consistent with allergic conjunctivitis, she has not had any viral or infectious symptoms.  No physical exam findings to suggest a bacterial conjunctivitis.  She not having any visual deficits, no eye  impairment, does not wear contacts.  I do not think she needs antibiotics, no pain with EOMs do not think this is orbital cellulitis.  Return precautions were discussed, ophthalmology follow-up discussed, stable for discharge with antihistamine ophthalmic drops.        Final Clinical Impression(s) / ED Diagnoses Final diagnoses:  Allergic conjunctivitis of both eyes    Rx / DC Orders ED Discharge Orders          Ordered    Olopatadine HCl 0.2 % SOLN  2 times daily        04/15/22 1726              Sherrill Raring, PA-C 04/15/22 2254    Drenda Freeze, MD 04/16/22 [id number]

## 2022-04-15 NOTE — Discharge Instructions (Addendum)
Use the olopatadine drops 1 drop in each eye twice a day (once in morning, once in evening)  This should help reduce the itching in the eye, use for the next week or until symptoms resolve.  Follow-up with your ophthalmologist later this week if symptoms persist.  Return to the ED for loss of vision, pain with eye movement, drainage, fevers, new or concerning symptoms.

## 2022-04-15 NOTE — ED Notes (Signed)
Discharge instructions reviewed with patient. Patient verbalizes understanding, no further questions at this time. Medications/prescriptions and follow up information provided. No acute distress noted at time of departure.  

## 2022-04-15 NOTE — ED Triage Notes (Signed)
C/O bilateral eye redness started today.

## 2022-04-26 DIAGNOSIS — C44519 Basal cell carcinoma of skin of other part of trunk: Secondary | ICD-10-CM | POA: Diagnosis not present

## 2022-04-29 DIAGNOSIS — L905 Scar conditions and fibrosis of skin: Secondary | ICD-10-CM | POA: Diagnosis not present

## 2022-05-12 DIAGNOSIS — E7800 Pure hypercholesterolemia, unspecified: Secondary | ICD-10-CM | POA: Diagnosis not present

## 2022-05-12 DIAGNOSIS — Z23 Encounter for immunization: Secondary | ICD-10-CM | POA: Diagnosis not present

## 2022-05-12 DIAGNOSIS — J45909 Unspecified asthma, uncomplicated: Secondary | ICD-10-CM | POA: Diagnosis not present

## 2022-05-12 DIAGNOSIS — I129 Hypertensive chronic kidney disease with stage 1 through stage 4 chronic kidney disease, or unspecified chronic kidney disease: Secondary | ICD-10-CM | POA: Diagnosis not present

## 2022-05-12 DIAGNOSIS — Z0000 Encounter for general adult medical examination without abnormal findings: Secondary | ICD-10-CM | POA: Diagnosis not present

## 2022-05-12 DIAGNOSIS — N1831 Chronic kidney disease, stage 3a: Secondary | ICD-10-CM | POA: Diagnosis not present

## 2022-05-12 DIAGNOSIS — I700 Atherosclerosis of aorta: Secondary | ICD-10-CM | POA: Diagnosis not present

## 2022-05-12 DIAGNOSIS — E039 Hypothyroidism, unspecified: Secondary | ICD-10-CM | POA: Diagnosis not present

## 2022-05-25 DIAGNOSIS — C44519 Basal cell carcinoma of skin of other part of trunk: Secondary | ICD-10-CM | POA: Diagnosis not present

## 2022-06-11 ENCOUNTER — Encounter (INDEPENDENT_AMBULATORY_CARE_PROVIDER_SITE_OTHER): Payer: Medicare Other | Admitting: Ophthalmology

## 2022-06-11 DIAGNOSIS — H43813 Vitreous degeneration, bilateral: Secondary | ICD-10-CM | POA: Diagnosis not present

## 2022-06-11 DIAGNOSIS — H35033 Hypertensive retinopathy, bilateral: Secondary | ICD-10-CM

## 2022-06-11 DIAGNOSIS — H35341 Macular cyst, hole, or pseudohole, right eye: Secondary | ICD-10-CM | POA: Diagnosis not present

## 2022-06-11 DIAGNOSIS — I10 Essential (primary) hypertension: Secondary | ICD-10-CM

## 2022-06-11 DIAGNOSIS — H353122 Nonexudative age-related macular degeneration, left eye, intermediate dry stage: Secondary | ICD-10-CM | POA: Diagnosis not present

## 2022-07-15 DIAGNOSIS — H35371 Puckering of macula, right eye: Secondary | ICD-10-CM | POA: Diagnosis not present

## 2022-07-15 DIAGNOSIS — Z961 Presence of intraocular lens: Secondary | ICD-10-CM | POA: Diagnosis not present

## 2022-07-15 DIAGNOSIS — H353131 Nonexudative age-related macular degeneration, bilateral, early dry stage: Secondary | ICD-10-CM | POA: Diagnosis not present

## 2022-07-15 DIAGNOSIS — H43813 Vitreous degeneration, bilateral: Secondary | ICD-10-CM | POA: Diagnosis not present

## 2022-07-15 DIAGNOSIS — H18513 Endothelial corneal dystrophy, bilateral: Secondary | ICD-10-CM | POA: Diagnosis not present

## 2022-07-21 DIAGNOSIS — Z1231 Encounter for screening mammogram for malignant neoplasm of breast: Secondary | ICD-10-CM | POA: Diagnosis not present

## 2022-07-21 DIAGNOSIS — R92323 Mammographic fibroglandular density, bilateral breasts: Secondary | ICD-10-CM | POA: Diagnosis not present

## 2022-08-04 DIAGNOSIS — Z129 Encounter for screening for malignant neoplasm, site unspecified: Secondary | ICD-10-CM | POA: Diagnosis not present

## 2022-08-04 DIAGNOSIS — Z85828 Personal history of other malignant neoplasm of skin: Secondary | ICD-10-CM | POA: Diagnosis not present

## 2022-08-04 DIAGNOSIS — C44319 Basal cell carcinoma of skin of other parts of face: Secondary | ICD-10-CM | POA: Diagnosis not present

## 2022-08-04 DIAGNOSIS — D225 Melanocytic nevi of trunk: Secondary | ICD-10-CM | POA: Diagnosis not present

## 2022-08-04 DIAGNOSIS — D485 Neoplasm of uncertain behavior of skin: Secondary | ICD-10-CM | POA: Diagnosis not present

## 2022-09-27 DIAGNOSIS — C44319 Basal cell carcinoma of skin of other parts of face: Secondary | ICD-10-CM | POA: Diagnosis not present

## 2022-10-01 DIAGNOSIS — L905 Scar conditions and fibrosis of skin: Secondary | ICD-10-CM | POA: Diagnosis not present

## 2022-10-11 DIAGNOSIS — Z23 Encounter for immunization: Secondary | ICD-10-CM | POA: Diagnosis not present

## 2022-11-12 DIAGNOSIS — Z23 Encounter for immunization: Secondary | ICD-10-CM | POA: Diagnosis not present

## 2022-12-14 DIAGNOSIS — M542 Cervicalgia: Secondary | ICD-10-CM | POA: Diagnosis not present

## 2023-01-12 DIAGNOSIS — L821 Other seborrheic keratosis: Secondary | ICD-10-CM | POA: Diagnosis not present

## 2023-01-12 DIAGNOSIS — Z85828 Personal history of other malignant neoplasm of skin: Secondary | ICD-10-CM | POA: Diagnosis not present

## 2023-01-24 DIAGNOSIS — M545 Low back pain, unspecified: Secondary | ICD-10-CM | POA: Diagnosis not present

## 2023-01-31 DIAGNOSIS — M545 Low back pain, unspecified: Secondary | ICD-10-CM | POA: Diagnosis not present

## 2023-01-31 DIAGNOSIS — I700 Atherosclerosis of aorta: Secondary | ICD-10-CM | POA: Diagnosis not present

## 2023-01-31 DIAGNOSIS — M47816 Spondylosis without myelopathy or radiculopathy, lumbar region: Secondary | ICD-10-CM | POA: Diagnosis not present

## 2023-02-05 ENCOUNTER — Ambulatory Visit
Admission: EM | Admit: 2023-02-05 | Discharge: 2023-02-05 | Disposition: A | Discharge status: Home / Self Care | Payer: Medicare Other | Attending: Family Medicine | Admitting: Family Medicine

## 2023-02-05 DIAGNOSIS — B9789 Other viral agents as the cause of diseases classified elsewhere: Secondary | ICD-10-CM | POA: Diagnosis not present

## 2023-02-05 DIAGNOSIS — J4540 Moderate persistent asthma, uncomplicated: Secondary | ICD-10-CM | POA: Diagnosis not present

## 2023-02-05 DIAGNOSIS — J988 Other specified respiratory disorders: Secondary | ICD-10-CM | POA: Diagnosis not present

## 2023-02-05 LAB — POCT INFLUENZA A/B
Influenza A, POC: NEGATIVE
Influenza B, POC: NEGATIVE

## 2023-02-05 MED ORDER — ALBUTEROL SULFATE HFA 108 (90 BASE) MCG/ACT IN AERS: 1.0000 | INHALATION_SPRAY | Freq: Four times a day (QID) | RESPIRATORY_TRACT | 0 refills | Status: AC | PRN

## 2023-02-05 MED ORDER — PREDNISONE 50 MG PO TABS: 50.0000 mg | ORAL_TABLET | Freq: Every day | ORAL | 0 refills | Status: AC

## 2023-02-05 MED ORDER — PROMETHAZINE-DM 6.25-15 MG/5ML PO SYRP: 2.5000 mL | ORAL_SOLUTION | Freq: Three times a day (TID) | ORAL | 0 refills | Status: AC | PRN

## 2023-02-05 NOTE — ED Provider Notes (Signed)
 [facility]  Note:  This document was prepared using Conservation officer, historic buildings and may include unintentional dictation errors.  MRN: [id number] DOB: 1945-06-28  Subjective:   RODERICK SWEEZY is a 78 y.o. female presenting for 3-day history of acute onset body aches, coughing, fatigue, chest tightness and slight wheezing.  She did have throat pain initially but is now resolved.  Has used over-the-counter medication with some relief.  Has a history of asthma. No smoking of any kind including cigarettes, cigars, vaping, marijuana use.    No current facility-administered medications for this encounter.  Current Outpatient Medications:    Olopatadine  HCl 0.2 % SOLN, Apply 1 drop to eye in the morning and at bedtime., Disp: 2.6 mL, Rfl: 1   predniSONE  (DELTASONE ) 50 MG tablet, One tablet a day, Disp: 5 tablet, Rfl: 0   promethazine -dextromethorphan (PROMETHAZINE -DM) 6.25-15 MG/5ML syrup, Take 5 mLs by mouth 4 (four) times daily as needed for cough., Disp: 118 mL, Rfl: 0   Allergies  Allergen Reactions   Lovastatin Other (See Comments)    Leg pain    Past Medical History:  Diagnosis Date   Asthma    Family history of anesthesia complication    Mother has N/V   GERD (gastroesophageal reflux disease)    tums prn   Hypertension    Hypothyroidism      Past Surgical History:  Procedure Laterality Date   BREAST SURGERY     right breast biopsy   EYE SURGERY Bilateral    bil Restore Lens   NASAL POLYP SURGERY  2010   POLYPECTOMY Bilateral 11/22/2013   Procedure: POLYPECTOMY NASAL;  Surgeon: Alm Bouche, MD;  Location: [facility] OR;  Service: ENT;  Laterality: Bilateral;   SINUS ENDO W/FUSION Bilateral 11/22/2013   Procedure: REVISION BILATERAL ENDOSCOPIC SINUS SURGERY;  Surgeon: Alm Bouche, MD;  Location: [facility] OR;  Service: ENT;  Laterality: Bilateral;   THYROID  SURGERY  1970's    Removed 3/4    No family history on file.  Social History    Tobacco Use   Smoking status: Never   Smokeless tobacco: Never  Vaping Use   Vaping status: Never Used  Substance Use Topics   Alcohol use: Yes    Alcohol/week: 2.0 standard drinks of alcohol    Types: 2 drink(s) per week    Comment: occ   Drug use: No    ROS   Objective:   Vitals: BP 132/73 (BP Location: Left Arm)   Pulse 72   Temp 99.5 F (37.5 C) (Oral)   Resp 16   Ht 5' 6.5 (1.689 m)   Wt 189 lb (85.7 kg)   SpO2 97%   BMI 30.05 kg/m   Physical Exam Constitutional:      General: She is not in acute distress.    Appearance: Normal appearance. She is well-developed. She is not ill-appearing, toxic-appearing or diaphoretic.  HENT:     Head: Normocephalic and atraumatic.     Nose: Nose normal.     Mouth/Throat:     Mouth: Mucous membranes are moist.  Eyes:     General: No scleral icterus.       Right eye: No discharge.        Left eye: No discharge.     Extraocular Movements: Extraocular movements intact.  Cardiovascular:     Rate and Rhythm: Normal rate and regular rhythm.     Heart sounds: Normal heart sounds. No murmur heard.    No  friction rub. No gallop.  Pulmonary:     Effort: Pulmonary effort is normal. No respiratory distress.     Breath sounds: No stridor. Examination of the right-middle field reveals wheezing. Examination of the left-middle field reveals wheezing. Wheezing (faint) present. No rhonchi or rales.  Chest:     Chest wall: No tenderness.  Skin:    General: Skin is warm and dry.  Neurological:     General: No focal deficit present.     Mental Status: She is alert and oriented to person, place, and time.  Psychiatric:        Mood and Affect: Mood normal.        Behavior: Behavior normal.     Results for orders placed or performed during the hospital encounter of 02/05/23 (from the past 24 hours)  POCT Influenza A/B     Status: None   Collection Time: 02/05/23 11:13 AM  Result Value Ref Range   Influenza A, POC Negative  Negative   Influenza B, POC Negative Negative    Assessment and Plan :   PDMP not reviewed this encounter.  1. Viral respiratory infection   2. Moderate persistent asthma, uncomplicated    Refilled her albuterol .  Given her respiratory exam, symptoms recommended a oral prednisone  course.  Otherwise, suspect viral URI, viral syndrome. Physical exam findings reassuring and vital signs stable for discharge. Advised supportive care, offered symptomatic relief. Counseled patient on potential for adverse effects with medications prescribed/recommended today, ER and return-to-clinic precautions discussed, patient verbalized understanding.     Christopher Savannah, [place] 02/05/23 1423

## 2023-02-05 NOTE — ED Triage Notes (Addendum)
 Patient presents with cough, body aches and fatigue x day 3. Patient states she did have a sore throat that went away. Treated with Coricidin without relief.

## 2023-05-26 DIAGNOSIS — Z0000 Encounter for general adult medical examination without abnormal findings: Secondary | ICD-10-CM | POA: Diagnosis not present

## 2023-05-26 DIAGNOSIS — I129 Hypertensive chronic kidney disease with stage 1 through stage 4 chronic kidney disease, or unspecified chronic kidney disease: Secondary | ICD-10-CM | POA: Diagnosis not present

## 2023-05-26 DIAGNOSIS — J309 Allergic rhinitis, unspecified: Secondary | ICD-10-CM | POA: Diagnosis not present

## 2023-05-26 DIAGNOSIS — E039 Hypothyroidism, unspecified: Secondary | ICD-10-CM | POA: Diagnosis not present

## 2023-05-26 DIAGNOSIS — J45909 Unspecified asthma, uncomplicated: Secondary | ICD-10-CM | POA: Diagnosis not present

## 2023-05-26 DIAGNOSIS — E7800 Pure hypercholesterolemia, unspecified: Secondary | ICD-10-CM | POA: Diagnosis not present

## 2023-05-26 DIAGNOSIS — Z1211 Encounter for screening for malignant neoplasm of colon: Secondary | ICD-10-CM | POA: Diagnosis not present

## 2023-05-26 DIAGNOSIS — N1831 Chronic kidney disease, stage 3a: Secondary | ICD-10-CM | POA: Diagnosis not present

## 2023-05-31 DIAGNOSIS — Z1211 Encounter for screening for malignant neoplasm of colon: Secondary | ICD-10-CM | POA: Diagnosis not present

## 2023-06-09 ENCOUNTER — Encounter (INDEPENDENT_AMBULATORY_CARE_PROVIDER_SITE_OTHER): Payer: Medicare Other | Admitting: Ophthalmology

## 2023-06-09 DIAGNOSIS — H35341 Macular cyst, hole, or pseudohole, right eye: Secondary | ICD-10-CM | POA: Diagnosis not present

## 2023-06-09 DIAGNOSIS — H353132 Nonexudative age-related macular degeneration, bilateral, intermediate dry stage: Secondary | ICD-10-CM | POA: Diagnosis not present

## 2023-06-09 DIAGNOSIS — I10 Essential (primary) hypertension: Secondary | ICD-10-CM | POA: Diagnosis not present

## 2023-06-09 DIAGNOSIS — H43813 Vitreous degeneration, bilateral: Secondary | ICD-10-CM

## 2023-06-09 DIAGNOSIS — H35033 Hypertensive retinopathy, bilateral: Secondary | ICD-10-CM

## 2023-06-22 DIAGNOSIS — R195 Other fecal abnormalities: Secondary | ICD-10-CM | POA: Diagnosis not present

## 2023-07-07 DIAGNOSIS — K5730 Diverticulosis of large intestine without perforation or abscess without bleeding: Secondary | ICD-10-CM | POA: Diagnosis not present

## 2023-07-07 DIAGNOSIS — D123 Benign neoplasm of transverse colon: Secondary | ICD-10-CM | POA: Diagnosis not present

## 2023-07-07 DIAGNOSIS — R195 Other fecal abnormalities: Secondary | ICD-10-CM | POA: Diagnosis not present

## 2023-07-07 DIAGNOSIS — Z09 Encounter for follow-up examination after completed treatment for conditions other than malignant neoplasm: Secondary | ICD-10-CM | POA: Diagnosis not present

## 2023-07-07 DIAGNOSIS — Z800 Family history of malignant neoplasm of digestive organs: Secondary | ICD-10-CM | POA: Diagnosis not present

## 2023-07-07 DIAGNOSIS — Z860101 Personal history of adenomatous and serrated colon polyps: Secondary | ICD-10-CM | POA: Diagnosis not present

## 2023-07-12 DIAGNOSIS — D123 Benign neoplasm of transverse colon: Secondary | ICD-10-CM | POA: Diagnosis not present

## 2023-07-28 DIAGNOSIS — Z1231 Encounter for screening mammogram for malignant neoplasm of breast: Secondary | ICD-10-CM | POA: Diagnosis not present

## 2023-07-28 DIAGNOSIS — R92323 Mammographic fibroglandular density, bilateral breasts: Secondary | ICD-10-CM | POA: Diagnosis not present

## 2023-08-10 DIAGNOSIS — Z85828 Personal history of other malignant neoplasm of skin: Secondary | ICD-10-CM | POA: Diagnosis not present

## 2023-08-10 DIAGNOSIS — Z129 Encounter for screening for malignant neoplasm, site unspecified: Secondary | ICD-10-CM | POA: Diagnosis not present

## 2023-08-10 DIAGNOSIS — D225 Melanocytic nevi of trunk: Secondary | ICD-10-CM | POA: Diagnosis not present

## 2023-08-10 DIAGNOSIS — I781 Nevus, non-neoplastic: Secondary | ICD-10-CM | POA: Diagnosis not present

## 2023-08-10 DIAGNOSIS — L821 Other seborrheic keratosis: Secondary | ICD-10-CM | POA: Diagnosis not present

## 2023-09-28 DIAGNOSIS — Z23 Encounter for immunization: Secondary | ICD-10-CM | POA: Diagnosis not present

## 2023-11-11 DIAGNOSIS — Z23 Encounter for immunization: Secondary | ICD-10-CM | POA: Diagnosis not present

## 2024-01-16 DIAGNOSIS — H353131 Nonexudative age-related macular degeneration, bilateral, early dry stage: Secondary | ICD-10-CM | POA: Diagnosis not present

## 2024-01-16 DIAGNOSIS — H35371 Puckering of macula, right eye: Secondary | ICD-10-CM | POA: Diagnosis not present

## 2024-01-16 DIAGNOSIS — H18513 Endothelial corneal dystrophy, bilateral: Secondary | ICD-10-CM | POA: Diagnosis not present

## 2024-01-16 DIAGNOSIS — H04123 Dry eye syndrome of bilateral lacrimal glands: Secondary | ICD-10-CM | POA: Diagnosis not present

## 2024-01-16 DIAGNOSIS — Z961 Presence of intraocular lens: Secondary | ICD-10-CM | POA: Diagnosis not present

## 2024-06-11 ENCOUNTER — Encounter (INDEPENDENT_AMBULATORY_CARE_PROVIDER_SITE_OTHER): Admitting: Ophthalmology
